# Patient Record
Sex: Female | Born: 2000
Health system: Southern US, Community
[De-identification: ages and names within clinical notes are randomized; demographics above are authoritative.]

## PROBLEM LIST (undated history)

## (undated) DIAGNOSIS — N920 Excessive and frequent menstruation with regular cycle: Secondary | ICD-10-CM

## (undated) DIAGNOSIS — Z23 Encounter for immunization: Secondary | ICD-10-CM

## (undated) DIAGNOSIS — D6859 Other primary thrombophilia: Secondary | ICD-10-CM

## (undated) HISTORY — DX: Encounter for immunization: Z23

## (undated) HISTORY — DX: Excessive and frequent menstruation with regular cycle: N92.0

## (undated) HISTORY — PX: NO PAST SURGERIES: SHX2092

---
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
## 2022-01-17 ENCOUNTER — Ambulatory Visit (INDEPENDENT_AMBULATORY_CARE_PROVIDER_SITE_OTHER): Payer: 59

## 2022-01-17 DIAGNOSIS — Z3042 Encounter for surveillance of injectable contraceptive: Secondary | ICD-10-CM

## 2022-01-17 MED ORDER — MEDROXYPROGESTERONE ACETATE 150 MG/ML IM SUSP
150.0000 mg | Freq: Once | INTRAMUSCULAR | Status: AC
  Administered 2022-01-17: 150 mg via INTRAMUSCULAR

## 2022-01-17 NOTE — Progress Notes (Signed)
Patient presents today for Depo Provera injection within dates. Given IM Left Upper Outer Quadrant. Patient tolerated well. Patient reports she has been on Depo for 3-4 years now. She started menses last week that lasted 4-5 days. It was not heavy, but this is the second time this has happened. Advised will report to prescribing provider Elmo Putt Copland, PAC.  ?

## 2022-02-08 ENCOUNTER — Encounter: Payer: Self-pay | Admitting: Emergency Medicine

## 2022-02-08 ENCOUNTER — Emergency Department
Admission: EM | Admit: 2022-02-08 | Discharge: 2022-02-08 | Disposition: A | Discharge status: Home / Self Care | Payer: 59 | Attending: Emergency Medicine | Admitting: Emergency Medicine

## 2022-02-08 ENCOUNTER — Emergency Department: Payer: 59

## 2022-02-08 ENCOUNTER — Telehealth: Payer: Self-pay

## 2022-02-08 ENCOUNTER — Other Ambulatory Visit: Payer: Self-pay

## 2022-02-08 DIAGNOSIS — N939 Abnormal uterine and vaginal bleeding, unspecified: Secondary | ICD-10-CM | POA: Diagnosis present

## 2022-02-08 LAB — URINALYSIS, ROUTINE W REFLEX MICROSCOPIC
Bilirubin Urine: NEGATIVE
Glucose, UA: NEGATIVE mg/dL
Ketones, ur: 5 mg/dL — AB
Leukocytes,Ua: NEGATIVE
Nitrite: NEGATIVE
Protein, ur: NEGATIVE mg/dL
Specific Gravity, Urine: 1.035 — ABNORMAL HIGH (ref 1.005–1.030)
pH: 6 (ref 5.0–8.0)

## 2022-02-08 LAB — COMPREHENSIVE METABOLIC PANEL
ALT: 12 U/L (ref 0–44)
AST: 15 U/L (ref 15–41)
Albumin: 4 g/dL (ref 3.5–5.0)
Alkaline Phosphatase: 52 U/L (ref 38–126)
Anion gap: 7 (ref 5–15)
BUN: 12 mg/dL (ref 6–20)
CO2: 25 mmol/L (ref 22–32)
Calcium: 8.9 mg/dL (ref 8.9–10.3)
Chloride: 109 mmol/L (ref 98–111)
Creatinine, Ser: 1.16 mg/dL — ABNORMAL HIGH (ref 0.44–1.00)
GFR, Estimated: >60 mL/min (ref >60)
Glucose, Bld: 91 mg/dL (ref 70–99)
Potassium: 4 mmol/L (ref 3.5–5.1)
Sodium: 141 mmol/L (ref 135–145)
Total Bilirubin: 0.5 mg/dL (ref 0.3–1.2)
Total Protein: 7.8 g/dL (ref 6.5–8.1)

## 2022-02-08 LAB — CBC WITH DIFFERENTIAL/PLATELET
Abs Immature Granulocytes: 0.02 10*3/uL (ref 0.00–0.07)
Basophils Absolute: 0 10*3/uL (ref 0.0–0.1)
Basophils Relative: 0 %
Eosinophils Absolute: 0 10*3/uL (ref 0.0–0.5)
Eosinophils Relative: 1 %
HCT: 35 % — ABNORMAL LOW (ref 36.0–46.0)
Hemoglobin: 11.4 g/dL — ABNORMAL LOW (ref 12.0–15.0)
Immature Granulocytes: 0 %
Lymphocytes Relative: 29 %
Lymphs Abs: 1.7 10*3/uL (ref 0.7–4.0)
MCH: 31.8 pg (ref 26.0–34.0)
MCHC: 32.6 g/dL (ref 30.0–36.0)
MCV: 97.5 fL (ref 80.0–100.0)
Monocytes Absolute: 0.5 10*3/uL (ref 0.1–1.0)
Monocytes Relative: 10 %
Neutro Abs: 3.4 10*3/uL (ref 1.7–7.7)
Neutrophils Relative %: 60 %
Platelets: 231 10*3/uL (ref 150–400)
RBC: 3.59 MIL/uL — ABNORMAL LOW (ref 3.87–5.11)
RDW: 11.6 % (ref 11.5–15.5)
Smear Review: NORMAL
WBC: 5.7 10*3/uL (ref 4.0–10.5)
nRBC: 0 % (ref 0.0–0.2)

## 2022-02-08 LAB — PREGNANCY, URINE: Preg Test, Ur: NEGATIVE

## 2022-02-08 LAB — POC URINE PREG, ED: Preg Test, Ur: NEGATIVE

## 2022-02-08 NOTE — ED Triage Notes (Signed)
C/O heaving vaginal bleeding and cramping.  Patient has history of Protein C deficiency and mom has history of fibroids and blood clots.  Patient currently taking Depo injections for birth control.  Last injection was last month.  AAOx3.  Skin warm and dry. NAD

## 2022-02-08 NOTE — Telephone Encounter (Signed)
Called Brenda Watson back and asked her how bad was [name] bleeding and she said she was soaking a pad within a hour. Advised her to go to the ER to get checked out and get an ultrasound.

## 2022-02-08 NOTE — ED Provider Notes (Signed)
[facility] Provider Note  Patient Contact: 3:34 PM (approximate)   History   Vaginal Bleeding   HPI  Brenda Watson is a 21 y.o. female with a history of protein C deficiency and menorrhagia presents to the emergency department with vaginal bleeding that started today.  Patient states that she has not had a menstrual cycle for the past 4 years due to Depo-Provera injections.  She states that she has had some cramping but no abdominal pain.  No flank pain, dysuria or increased urinary frequency.  She denies falls or mechanisms of trauma.  Patient states that she has a family history of uterine fibroids and would like to be evaluated for such.  She denies fever and chills at home and possibility of pregnancy.      Physical Exam   Triage Vital Signs: ED Triage Vitals  Enc Vitals Group     BP 02/08/22 1515 128/86     Pulse Rate 02/08/22 1515 70     Resp 02/08/22 1515 20     Temp 02/08/22 1515 98.1 F (36.7 C)     Temp src --      SpO2 02/08/22 1515 98 %     Weight 02/08/22 1434 132 lb 15 oz (60.3 kg)     Height 02/08/22 1434 5' (1.524 m)     Head Circumference --      Peak Flow --      Pain Score 02/08/22 1434 0     Pain Loc --      Pain Edu? --      Excl. in GC? --     Most recent vital signs: Vitals:   02/08/22 1515 02/08/22 1700  BP: 128/86 122/74  Pulse: 70 74  Resp: 20 20  Temp: 98.1 F (36.7 C)   SpO2: 98% 98%     General: Alert and in no acute distress. Eyes:  PERRL. EOMI. Head: No acute traumatic findings ENT:      Ears:       Nose: No congestion/rhinnorhea.      Mouth/Throat: Mucous membranes are moist.  Neck: No stridor. No cervical spine tenderness to palpation. Cardiovascular:  Good peripheral perfusion Respiratory: Normal respiratory effort without tachypnea or retractions. Lungs CTAB. Good air entry to the bases with no decreased or absent breath sounds. Gastrointestinal: Bowel sounds 4 quadrants. Soft and nontender  to palpation. No guarding or rigidity. No palpable masses. No distention. No CVA tenderness. Musculoskeletal: Full range of motion to all extremities.  Neurologic:  No gross focal neurologic deficits are appreciated.  Skin:   No rash noted Other:   ED Results / Procedures / Treatments   Labs (all labs ordered are listed, but only abnormal results are displayed) Labs Reviewed  CBC WITH DIFFERENTIAL/PLATELET - Abnormal; Notable for the following components:      Result Value   RBC 3.59 (*)    Hemoglobin 11.4 (*)    HCT 35.0 (*)    All other components within normal limits  COMPREHENSIVE METABOLIC PANEL - Abnormal; Notable for the following components:   Creatinine, Ser 1.16 (*)    All other components within normal limits  URINALYSIS, ROUTINE W REFLEX MICROSCOPIC - Abnormal; Notable for the following components:   Color, Urine YELLOW (*)    APPearance HAZY (*)    Specific Gravity, Urine 1.035 (*)    Hgb urine dipstick SMALL (*)    Ketones, ur 5 (*)    Bacteria, UA FEW (*)  All other components within normal limits  PREGNANCY, URINE  POC URINE PREG, ED      RADIOLOGY  I personally viewed and evaluated these images as part of my medical decision making, as well as reviewing the written report by the radiologist.  ED Provider Interpretation: I personally interpreted pelvic ultrasound and there was no acute abnormality.   PROCEDURES:  Critical Care performed: No  Procedures   MEDICATIONS ORDERED IN ED: Medications - No data to display   IMPRESSION / MDM / ASSESSMENT AND PLAN / ED COURSE  I reviewed the triage vital signs and the nursing notes.                             Assessment and plan:  Vaginal bleeding Differential diagnosis includes, but is not limited to, menorrhagia, adenomyosis, hemorrhagic ovarian cyst  21 year old female presents to the emergency department with irregular vaginal bleeding.  CBC and CMP unremarkable.  Urine pregnancy test  negative.  Urinalysis is not suggestive of UTI.  Pelvic ultrasound shows no acute abnormality.  Recommended follow-up with gynecology.  Return precautions were given to return with new or worsening symptoms.      FINAL CLINICAL IMPRESSION(S) / ED DIAGNOSES   Final diagnoses:  Vaginal bleeding     Rx / DC Orders   ED Discharge Orders     None        Note:  This document was prepared using Dragon voice recognition software and may include unintentional dictation errors.   Pia Mau [place], PA-C 02/08/22 2346    Sharman Cheek, MD 02/13/22 1935

## 2022-02-08 NOTE — Discharge Instructions (Signed)
Please make follow-up appointment with gynecology.

## 2022-02-08 NOTE — Telephone Encounter (Signed)
Brenda Watson, Sallye Lat Sayler mother called triage line this morning regarding Brenda Watson's heavy bleeding, bad cramps and in a lot of pain. Byrd Hesselbach the mother does have fibroids, and  they both have the Protein C Deficiency and had a daughter to pass last year with this issue.

## 2022-04-03 ENCOUNTER — Other Ambulatory Visit: Payer: Self-pay | Admitting: Obstetrics and Gynecology

## 2022-04-03 DIAGNOSIS — Z3042 Encounter for surveillance of injectable contraceptive: Secondary | ICD-10-CM

## 2022-04-04 ENCOUNTER — Ambulatory Visit (INDEPENDENT_AMBULATORY_CARE_PROVIDER_SITE_OTHER): Payer: 59

## 2022-04-04 ENCOUNTER — Other Ambulatory Visit: Payer: Self-pay | Admitting: Obstetrics and Gynecology

## 2022-04-04 DIAGNOSIS — Z3042 Encounter for surveillance of injectable contraceptive: Secondary | ICD-10-CM

## 2022-04-04 MED ORDER — MEDROXYPROGESTERONE ACETATE 150 MG/ML IM SUSP
150.0000 mg | Freq: Once | INTRAMUSCULAR | Status: AC
  Administered 2022-04-04: 150 mg via INTRAMUSCULAR

## 2022-04-04 NOTE — Progress Notes (Signed)
Date last pap: pt has not had a pap smear d/t age. Last Depo-Provera: 5/15/223. Side Effects if any: n/a. Serum HCG indicated? N/a. Depo-Provera 150 mg IM given by: Doree Albee . Next appointment due 10/16-10/30.

## 2022-04-25 ENCOUNTER — Ambulatory Visit: Payer: 59 | Admitting: Physician Assistant

## 2022-05-23 ENCOUNTER — Ambulatory Visit: Payer: 59 | Admitting: Internal Medicine

## 2022-05-24 ENCOUNTER — Ambulatory Visit (INDEPENDENT_AMBULATORY_CARE_PROVIDER_SITE_OTHER): Payer: 59 | Admitting: Obstetrics and Gynecology

## 2022-05-24 ENCOUNTER — Encounter: Payer: Self-pay | Admitting: Obstetrics and Gynecology

## 2022-05-24 ENCOUNTER — Other Ambulatory Visit (HOSPITAL_COMMUNITY)
Admission: RE | Admit: 2022-05-24 | Discharge: 2022-05-24 | Disposition: A | Discharge status: Home / Self Care | Payer: 59 | Source: Ambulatory Visit | Attending: Obstetrics and Gynecology | Admitting: Obstetrics and Gynecology

## 2022-05-24 VITALS — BP 120/60 | Ht 61.0 in | Wt 150.0 lb

## 2022-05-24 DIAGNOSIS — Z3042 Encounter for surveillance of injectable contraceptive: Secondary | ICD-10-CM | POA: Diagnosis not present

## 2022-05-24 DIAGNOSIS — Z01419 Encounter for gynecological examination (general) (routine) without abnormal findings: Secondary | ICD-10-CM | POA: Diagnosis not present

## 2022-05-24 DIAGNOSIS — Z113 Encounter for screening for infections with a predominantly sexual mode of transmission: Secondary | ICD-10-CM | POA: Insufficient documentation

## 2022-05-24 DIAGNOSIS — D6859 Other primary thrombophilia: Secondary | ICD-10-CM | POA: Diagnosis not present

## 2022-05-24 MED ORDER — MEDROXYPROGESTERONE ACETATE 150 MG/ML IM SUSP: 150.0000 mg | INTRAMUSCULAR | Status: AC

## 2022-05-24 NOTE — Progress Notes (Signed)
PCP:  Margarita Mail, DO   Chief Complaint  Patient presents with   Gynecologic Exam    No concerns     HPI:      [name] is a 21 y.o. G0P0000 who LMP was No LMP recorded. Patient has had an injection., presents today for her annual examination.  Her menses are absent due to depo. Started for menorrhagia. Hx of Protein C def.  Dysmenorrhea none. She has occas BTB/dysmen before next depo due.   Sex activity: has female partner. No vaginal penetration.  Last Pap: N/A due to age Hx of STDs: none  There is no FH of breast cancer. There is a FH of ovarian vs uterine cancer in her mat aunt. Her mom will clarify dx. The patient does not do self-breast exams.  Tobacco use: The patient denies current or previous tobacco use. Alcohol use: none Marijuana daily  Exercise: moderately active  She does get adequate calcium and Vitamin D in her diet. Gardasil completed  Past Medical History:  Diagnosis Date   Menorrhagia    Protein C deficiency [facility])    mother states pt has deficiency. MD telephone call [facility] 12/06/17 says she does not but decreased levels on 2/19 labs   Vaccine for human papilloma virus (HPV) types 6, 11, 16, and 18 administered     Past Surgical History:  Procedure Laterality Date   TONSILLECTOMY Bilateral 09/04/2018   Procedure: TONSILLECTOMY;  Surgeon: Geanie Logan, MD;  Location: [facility];  Service: ENT;  Laterality: Bilateral;    Family History  Problem Relation Age of Onset   Protein C deficiency Mother    Migraines Mother    Cancer Sister 32       leoblastoma   Ovarian cancer Maternal Aunt 42       vs uterine   Hypertension Maternal Grandmother    Asthma Paternal Grandmother     Social History   Socioeconomic History   Marital status: Single    Spouse name: Not on file   Number of children: Not on file   Years of education: Not on file   Highest education level: Not on file  Occupational History   Not on file  Tobacco  Use   Smoking status: Never   Smokeless tobacco: Never  Vaping Use   Vaping Use: Never used  Substance and Sexual Activity   Alcohol use: No   Drug use: Yes    Types: Marijuana   Sexual activity: Yes    Birth control/protection: Injection    Comment: c females  Other Topics Concern   Not on file  Social History Narrative   Not on file   Social Determinants of Health   Financial Resource Strain: Not on file  Food Insecurity: Not on file  Transportation Needs: Not on file  Physical Activity: Not on file  Stress: Not on file  Social Connections: Not on file  Intimate Partner Violence: Not on file    No current outpatient medications on file.  Current Facility-Administered Medications:    [START ON 06/20/2022] medroxyPROGESTERone (DEPO-PROVERA) injection 150 mg, 150 mg, Intramuscular, Q90 days, Slade Pierpoint B, PA-C   ROS:  Review of Systems  Constitutional:  Negative for fatigue, fever and unexpected weight change.  Respiratory:  Negative for cough, shortness of breath and wheezing.   Cardiovascular:  Negative for chest pain, palpitations and leg swelling.  Gastrointestinal:  Negative for blood in stool, constipation, diarrhea, nausea and vomiting.  Endocrine: Negative for cold  intolerance, heat intolerance and polyuria.  Genitourinary:  Negative for dyspareunia, dysuria, flank pain, frequency, genital sores, hematuria, menstrual problem, pelvic pain, urgency, vaginal bleeding, vaginal discharge and vaginal pain.  Musculoskeletal:  Negative for back pain, joint swelling and myalgias.  Skin:  Negative for rash.  Neurological:  Negative for dizziness, syncope, light-headedness, numbness and headaches.  Hematological:  Negative for adenopathy.  Psychiatric/Behavioral:  Negative for agitation, confusion, dysphoric mood, sleep disturbance and suicidal ideas. The patient is not nervous/anxious.    BREAST: No symptoms   Objective: BP 120/60   Ht 5\' 1"  (1.549 m)   Wt 150  lb (68 kg)   BMI 28.34 kg/m    Physical Exam Constitutional:      Appearance: She is well-developed.  Genitourinary:     Vulva normal.     Right Labia: No rash, tenderness or lesions.    Left Labia: No tenderness, lesions or rash.    No vaginal discharge, erythema or tenderness.      Right Adnexa: not tender and no mass present.    Left Adnexa: not tender and no mass present.    No cervical friability or polyp.     Uterus is not enlarged or tender.  Breasts:    Right: No mass, nipple discharge, skin change or tenderness.     Left: No mass, nipple discharge, skin change or tenderness.  Neck:     Thyroid: No thyromegaly.  Cardiovascular:     Rate and Rhythm: Normal rate and regular rhythm.     Heart sounds: Normal heart sounds. No murmur heard. Pulmonary:     Effort: Pulmonary effort is normal.     Breath sounds: Normal breath sounds.  Abdominal:     Palpations: Abdomen is soft.     Tenderness: There is no abdominal tenderness. There is no guarding or rebound.  Musculoskeletal:        General: Normal range of motion.     Cervical back: Normal range of motion.  Lymphadenopathy:     Cervical: No cervical adenopathy.  Neurological:     General: No focal deficit present.     Mental Status: She is alert and oriented to person, place, and time.     Cranial Nerves: No cranial nerve deficit.  Skin:    General: Skin is warm and dry.  Psychiatric:        Mood and Affect: Mood normal.        Behavior: Behavior normal.        Thought Content: Thought content normal.        Judgment: Judgment normal.  Vitals reviewed.   HYMEN INTACT  Assessment/Plan: Encounter for annual routine gynecological examination  Screening for STD (sexually transmitted disease) - Plan: Cervicovaginal ancillary only  Encounter for surveillance of injectable contraceptive - Plan: medroxyPROGESTERone (DEPO-PROVERA) injection 150 mg; Rx RF. Doing well, cont ca/Vit D  Protein C deficiency  ([place])  Family history of ovarian cancer--Qualifies for cancer genetic testing age 60 if ovarian.  Meds ordered this encounter  Medications   medroxyPROGESTERone (DEPO-PROVERA) injection 150 mg             GYN counsel adequate intake of calcium and vitamin D, diet and exercise     F/U  Return in about 1 year (around 05/25/2023).  Hareem Surowiec B. Domnick Chervenak, PA-C 05/24/2022 3:20 PM

## 2022-05-24 NOTE — Patient Instructions (Signed)
I value your feedback and you entrusting us with your care. If you get a [facility] patient survey, I would appreciate you taking the time to let us know about your experience today. Thank you! ? ? ?

## 2022-05-26 ENCOUNTER — Encounter: Payer: Self-pay | Admitting: Internal Medicine

## 2022-05-26 ENCOUNTER — Ambulatory Visit
Admission: RE | Admit: 2022-05-26 | Discharge: 2022-05-26 | Disposition: A | Discharge status: Home / Self Care | Payer: 59 | Source: Ambulatory Visit | Attending: Internal Medicine | Admitting: Internal Medicine

## 2022-05-26 ENCOUNTER — Ambulatory Visit
Admission: RE | Admit: 2022-05-26 | Discharge: 2022-05-26 | Disposition: A | Discharge status: Home / Self Care | Payer: 59 | Attending: Internal Medicine | Admitting: Internal Medicine

## 2022-05-26 ENCOUNTER — Ambulatory Visit: Payer: 59 | Admitting: Internal Medicine

## 2022-05-26 VITALS — BP 116/72 | HR 85 | Temp 98.5°F | Resp 16 | Ht 61.75 in | Wt 152.3 lb

## 2022-05-26 DIAGNOSIS — Z13228 Encounter for screening for other metabolic disorders: Secondary | ICD-10-CM

## 2022-05-26 DIAGNOSIS — D649 Anemia, unspecified: Secondary | ICD-10-CM | POA: Diagnosis not present

## 2022-05-26 DIAGNOSIS — G8929 Other chronic pain: Secondary | ICD-10-CM

## 2022-05-26 DIAGNOSIS — M546 Pain in thoracic spine: Secondary | ICD-10-CM | POA: Insufficient documentation

## 2022-05-26 DIAGNOSIS — R198 Other specified symptoms and signs involving the digestive system and abdomen: Secondary | ICD-10-CM

## 2022-05-26 DIAGNOSIS — G43011 Migraine without aura, intractable, with status migrainosus: Secondary | ICD-10-CM

## 2022-05-26 LAB — CERVICOVAGINAL ANCILLARY ONLY
Chlamydia: NEGATIVE
Comment: NEGATIVE
Comment: NORMAL
Neisseria Gonorrhea: NEGATIVE

## 2022-05-26 MED ORDER — SUMATRIPTAN SUCCINATE 50 MG PO TABS: 50.0000 mg | ORAL_TABLET | ORAL | 0 refills | Status: DC | PRN

## 2022-05-26 NOTE — Progress Notes (Signed)
New Patient Office Visit  Subjective    Patient ID: Brenda Watson, female    DOB: Feb 17, 2001  Age: 21 y.o. MRN: [id number]  CC:  Chief Complaint  Patient presents with   Establish Care   Abdominal Pain    After eating w/ occasional diarrhea and constipation.  Hx  of anemia    HPI Brenda Watson presents to establish care.  Does have a history of menorrhagia, for which she has been on Depo-Provera injections since she was 88 or 21 years old.  She does follow with gynecology for this.  She is consistent with her timing of each injection.  She usually does not have periods while on the Depo-Provera, however she did have some breakthrough bleeding that lasted a few days the last 2 to 3 months a few days prior to when she was due for her next injection.  Of note, she did have some anemia on her most recent labs from June.  Her hemoglobin was 11.4.  Abdominal Pain: -Occurs about 15 minutes after eating, does not matter what type of food she is eating -Does have a sensitivity to dairy but tries to avoid this -Is having alternating diarrhea and constipation.  Diarrhea is very watery and constipation is small amount of stool without completely finishing.  She denies blood or dark stools. -She does have associated bilateral lower quadrant pain prior to the bowel movement that will resolve afterwards.  Thoracic back pain: -Chronic, will have mild episodes of thoracic pain at the level of T7-T8 on the left side.   -This will happen with stretching forward or certain positions and will cause a sharp electric-like pain that will last a couple seconds to a minute at most. -She does not take any medication for this -She was told that she has "advanced arthritis" in her back by her pediatrician -Lumbar x-ray and lumbar MRI done in August 2020 both negative  MIGRAINES Duration: years Onset: sudden Quality: sharp, dull, and aching Frequency: a few times a week -occurring about 3 times a week   Headache duration: Hours to all day Alleviating factors: Nothing, rest Aggravating factors: Bright lights, loud sounds and smells Headache status at time of visit: asymptomatic Treatments attempted: Treatments attempted: rest, ibuprofen, and aleve", excedrine   Aura: no Nausea:  no Vomiting: no Photophobia:  yes Phonophobia:  yes Mother and sister both have migraines and all 3 were patient at migraine clinic, however her sister was recently diagnosed with a brain tumor and they are no longer patients at the migraine clinic.  No outpatient encounter medications on file as of 05/26/2022.   Facility-Administered Encounter Medications as of 05/26/2022  Medication   [START ON 06/20/2022] medroxyPROGESTERone (DEPO-PROVERA) injection 150 mg    Past Medical History:  Diagnosis Date   Menorrhagia    Protein C deficiency [facility])    mother states pt has deficiency. MD telephone call [facility] 12/06/17 says she does not but decreased levels on 2/19 labs   Vaccine for human papilloma virus (HPV) types 6, 11, 16, and 18 administered     Past Surgical History:  Procedure Laterality Date   TONSILLECTOMY Bilateral 09/04/2018   Procedure: TONSILLECTOMY;  Surgeon: Geanie Logan, MD;  Location: [facility];  Service: ENT;  Laterality: Bilateral;    Family History  Problem Relation Age of Onset   Protein C deficiency Mother    Migraines Mother    Cancer Sister 53       leoblastoma   Ovarian cancer Maternal  Aunt 42       vs uterine   Hypertension Maternal Grandmother    Asthma Paternal Grandmother     Social History   Socioeconomic History   Marital status: Single    Spouse name: Not on file   Number of children: Not on file   Years of education: Not on file   Highest education level: Not on file  Occupational History   Not on file  Tobacco Use   Smoking status: Never   Smokeless tobacco: Never  Vaping Use   Vaping Use: Former  Substance and Sexual Activity   Alcohol use: No    Drug use: Yes    Types: Marijuana   Sexual activity: Yes    Birth control/protection: Injection    Comment: c females  Other Topics Concern   Not on file  Social History Narrative   Not on file   Social Determinants of Health   Financial Resource Strain: Not on file  Food Insecurity: Not on file  Transportation Needs: Not on file  Physical Activity: Not on file  Stress: Not on file  Social Connections: Not on file  Intimate Partner Violence: Not on file    Review of Systems  Constitutional:  Negative for chills and fever.  Eyes:  Negative for blurred vision.  Respiratory:  Negative for shortness of breath.   Cardiovascular:  Negative for chest pain.  Gastrointestinal:  Positive for abdominal pain, constipation and diarrhea. Negative for blood in stool, melena, nausea and vomiting.  Genitourinary:  Negative for dysuria and hematuria.  Musculoskeletal:  Positive for back pain.  Neurological:  Positive for headaches.        Objective    BP 116/72   Pulse 85   Temp 98.5 F (36.9 C)   Resp 16   Ht 5' 1.75" (1.568 m)   Wt 152 lb 4.8 oz (69.1 kg)   SpO2 96%   BMI 28.08 kg/m   Physical Exam Constitutional:      Appearance: Normal appearance.  HENT:     Head: Normocephalic and atraumatic.     Mouth/Throat:     Mouth: Mucous membranes are moist.     Pharynx: Oropharynx is clear.  Eyes:     Extraocular Movements: Extraocular movements intact.     Conjunctiva/sclera: Conjunctivae normal.     Pupils: Pupils are equal, round, and reactive to light.  Cardiovascular:     Rate and Rhythm: Normal rate and regular rhythm.  Pulmonary:     Effort: Pulmonary effort is normal.     Breath sounds: Normal breath sounds.  Abdominal:     General: Bowel sounds are normal. There is no distension.     Palpations: Abdomen is soft.     Tenderness: There is abdominal tenderness. There is no right CVA tenderness, left CVA tenderness, guarding or rebound.     Comments: Tender to  palpation in bilateral lower quadrants  Musculoskeletal:        General: No tenderness. Normal range of motion.     Right lower leg: No edema.     Left lower leg: No edema.     Comments: No tenderness to palpation of thoracic spine with appropriate range of motion.  Skin:    General: Skin is warm and dry.  Neurological:     General: No focal deficit present.     Mental Status: She is alert. Mental status is at baseline.  Psychiatric:        Mood and Affect: Mood  normal.        Behavior: Behavior normal.         Assessment & Plan:   1. Intractable migraine without aura and with status migrainosus: Patient only taking 2-3 Advil or Aleve with headaches.  Will treat with Imitrex 50 mg as needed for migraine.  Follow-up in 1 month for recheck.  - SUMAtriptan (IMITREX) 50 MG tablet; Take 1 tablet (50 mg total) by mouth as needed for migraine. May repeat in 2 hours if headache persists or recurs.  Dispense: 1 tablet; Refill: 0  2. Chronic left-sided thoracic back pain: Consistent with muscle spasm however will obtain a thoracic x-ray due to patient's wishes and vague history of arthritis.  Recommend gentle stretching, moist heat and anti-inflammatories as needed for pain.  - DG Thoracic Spine 2 View; Future  3. Alternating constipation and diarrhea: Consistent with IBS, discussed low FODMAP diet, which was printed and given to her.  We will also obtain celiac screening along with labs below.  - Celiac Disease Panel  4. Anemia, unspecified type: Hemoglobin slightly low on last set of labs, recheck today along with iron panel.  Patient is not currently having regular menstrual cycles, she is on Depo-Provera.  - CBC w/Diff/Platelet - Fe+TIBC+Fer  5. Screening for metabolic disorder: Recheck BMP today as well.  - Basic Metabolic Panel (BMET)   Return in about 4 weeks (around 06/23/2022).   Margarita Mail, DO

## 2022-05-26 NOTE — Patient Instructions (Addendum)
It was great seeing you today!  Plan discussed at today's visit: -Blood work ordered today, results will be uploaded to Brenda Watson.  -Back x-ray today although pain is more likely muscular, recommend gentle stretching and anti-inflammatory gel like Voltaren for pain -Medication sent to pharmacy for migraines - take at the onset of symptoms and can take 2 hours later if symptoms still persist. Do not take more than 2 doses in 24 hour period.  Follow up in: 1 month   Take care and let us know if you have any questions or concerns prior to your next visit.  Dr. Rosana Watson  Low-FODMAP Eating Plan  FODMAP stands for fermentable oligosaccharides, disaccharides, monosaccharides, and polyols. These are sugars that are hard for some people to digest. A low-FODMAP eating plan may help some people who have irritable bowel syndrome (IBS) and certain other bowel (intestinal) diseases to manage their symptoms. This meal plan can be complicated to follow. Work with a diet and nutrition specialist (dietitian) to make a low-FODMAP eating plan that is right for you. A dietitian can help make sure that you get enough nutrition from this diet. What are tips for following this plan? Reading food labels Check labels for hidden FODMAPs such as: High-fructose syrup. Honey. Agave. Natural fruit flavors. Onion or garlic powder. Choose low-FODMAP foods that contain 3-4 grams of fiber per serving. Check food labels for serving sizes. Eat only one serving at a time to make sure FODMAP levels stay low. Shopping Shop with a list of foods that are recommended on this diet and make a meal plan. Meal planning Follow a low-FODMAP eating plan for up to 6 weeks, or as told by your health care provider or dietitian. To follow the eating plan: Eliminate high-FODMAP foods from your diet completely. Choose only low-FODMAP foods to eat. You will do this for 2-6 weeks. Gradually reintroduce high-FODMAP foods into your diet one at a  time. Most people should wait a few days before introducing the next new high-FODMAP food into their meal plan. Your dietitian can recommend how quickly you may reintroduce foods. Keep a daily record of what and how much you eat and drink. Make note of any symptoms that you have after eating. Review your daily record with a dietitian regularly to identify which foods you can eat and which foods you should avoid. General tips Drink enough fluid each day to keep your urine pale yellow. Avoid processed foods. These often have added sugar and may be high in FODMAPs. Avoid most dairy products, whole grains, and sweeteners. Work with a dietitian to make sure you get enough fiber in your diet. Avoid high FODMAP foods at meals to manage symptoms. Recommended foods Fruits Bananas, oranges, tangerines, lemons, limes, blueberries, raspberries, strawberries, grapes, cantaloupe, honeydew melon, kiwi, papaya, passion fruit, and pineapple. Limited amounts of dried cranberries, banana chips, and shredded coconut. Vegetables Eggplant, zucchini, cucumber, peppers, green beans, bean sprouts, lettuce, arugula, kale, Swiss chard, spinach, collard greens, bok choy, summer squash, potato, and tomato. Limited amounts of corn, carrot, and sweet potato. Green parts of scallions. Grains Gluten-free grains, such as rice, oats, buckwheat, quinoa, corn, polenta, and millet. Gluten-free pasta, bread, or cereal. Rice noodles. Corn tortillas. Meats and other proteins Unseasoned beef, pork, poultry, or fish. Eggs. Brenda Watson. Tofu (firm) and tempeh. Limited amounts of nuts and seeds, such as almonds, walnuts, [place] nuts, pecans, peanuts, nut butters, pumpkin seeds, chia seeds, and sunflower seeds. Dairy Lactose-free milk, yogurt, and kefir. Lactose-free cottage cheese and ice  cream. Non-dairy milks, such as almond, coconut, hemp, and rice milk. Non-dairy yogurt. Limited amounts of goat cheese, brie, mozzarella, parmesan, swiss, and  other hard cheeses. Fats and oils Butter-free spreads. Vegetable oils, such as olive, canola, and sunflower oil. Seasoning and other foods Artificial sweeteners with names that do not end in "ol," such as aspartame, saccharine, and stevia. Maple syrup, white table sugar, raw sugar, brown sugar, and molasses. Mayonnaise, soy sauce, and tamari. Fresh basil, coriander, parsley, rosemary, and thyme. Beverages Water and mineral water. Sugar-sweetened soft drinks. Small amounts of orange juice or cranberry juice. Black and green tea. Most dry wines. Coffee. The items listed above may not be a complete list of foods and beverages you can eat. Contact a dietitian for more information. Foods to avoid Fruits Fresh, dried, and juiced forms of apple, pear, watermelon, peach, plum, cherries, apricots, blackberries, boysenberries, figs, nectarines, and mango. Avocado. Vegetables Chicory root, artichoke, asparagus, cabbage, snow peas, Brussels sprouts, broccoli, sugar snap peas, mushrooms, celery, and cauliflower. Onions, garlic, leeks, and the white part of scallions. Grains Wheat, including kamut, durum, and semolina. Barley and bulgur. Couscous. Wheat-based cereals. Wheat noodles, bread, crackers, and pastries. Meats and other proteins Fried or fatty meat. Sausage. Cashews and pistachios. Soybeans, baked beans, black beans, chickpeas, kidney beans, fava beans, navy beans, lentils, black-eyed peas, and split peas. Dairy Milk, yogurt, ice cream, and soft cheese. Cream and sour cream. Milk-based sauces. Custard. Buttermilk. Soy milk. Seasoning and other foods Any sugar-free gum or candy. Foods that contain artificial sweeteners such as sorbitol, mannitol, isomalt, or xylitol. Foods that contain honey, high-fructose corn syrup, or agave. Bouillon, vegetable stock, beef stock, and chicken stock. Garlic and onion powder. Condiments made with onion, such as hummus, chutney, pickles, relish, salad dressing, and  salsa. Tomato paste. Beverages Chicory-based drinks. Coffee substitutes. Chamomile tea. Fennel tea. Sweet or fortified wines such as port or sherry. Diet soft drinks made with isomalt, mannitol, maltitol, sorbitol, or xylitol. Apple, pear, and mango juice. Juices with high-fructose corn syrup. The items listed above may not be a complete list of foods and beverages you should avoid. Contact a dietitian for more information. Summary FODMAP stands for fermentable oligosaccharides, disaccharides, monosaccharides, and polyols. These are sugars that are hard for some people to digest. A low-FODMAP eating plan is a short-term diet that helps to ease symptoms of certain bowel diseases. The eating plan usually lasts up to 6 weeks. After that, high-FODMAP foods are reintroduced gradually and one at a time. This can help you find out which foods may be causing symptoms. A low-FODMAP eating plan can be complicated. It is best to work with a dietitian who has experience with this type of plan. This information is not intended to replace advice given to you by your health care provider. Make sure you discuss any questions you have with your health care provider. Document Revised: 01/09/2020 Document Reviewed: 01/09/2020 Elsevier Patient Education  2023 [employer].

## 2022-05-27 LAB — CBC WITH DIFFERENTIAL/PLATELET
Absolute Monocytes: 398 cells/uL (ref 200–950)
Basophils Absolute: 20 cells/uL (ref 0–200)
Basophils Relative: 0.5 %
Eosinophils Absolute: 117 cells/uL (ref 15–500)
Eosinophils Relative: 3 %
HCT: 37 % (ref 35.0–45.0)
Hemoglobin: 12.4 g/dL (ref 11.7–15.5)
Lymphs Abs: 1661 cells/uL (ref 850–3900)
MCH: 33.2 pg — ABNORMAL HIGH (ref 27.0–33.0)
MCHC: 33.5 g/dL (ref 32.0–36.0)
MCV: 99.2 fL (ref 80.0–100.0)
MPV: 10.7 fL (ref 7.5–12.5)
Monocytes Relative: 10.2 %
Neutro Abs: 1704 cells/uL (ref 1500–7800)
Neutrophils Relative %: 43.7 %
Platelets: 239 10*3/uL (ref 140–400)
RBC: 3.73 10*6/uL — ABNORMAL LOW (ref 3.80–5.10)
RDW: 11.5 % (ref 11.0–15.0)
Total Lymphocyte: 42.6 %
WBC: 3.9 10*3/uL (ref 3.8–10.8)

## 2022-05-27 LAB — BASIC METABOLIC PANEL
BUN: 9 mg/dL (ref 7–20)
CO2: 24 mmol/L (ref 20–32)
Calcium: 9.2 mg/dL (ref 8.9–10.4)
Chloride: 109 mmol/L (ref 98–110)
Creat: 0.8 mg/dL (ref 0.50–0.96)
Glucose, Bld: 79 mg/dL (ref 65–99)
Potassium: 4.5 mmol/L (ref 3.8–5.1)
Sodium: 139 mmol/L (ref 135–146)

## 2022-05-27 LAB — CELIAC DISEASE PANEL
(tTG) Ab, IgA: <1 U/mL
(tTG) Ab, IgG: <1 U/mL
Gliadin IgA: <1 U/mL
Gliadin IgG: <1 U/mL
Immunoglobulin A: 258 mg/dL (ref 47–310)

## 2022-05-27 LAB — IRON,TIBC AND FERRITIN PANEL
%SAT: 30 % (calc) (ref 15–45)
Ferritin: 79 ng/mL (ref 16–154)
Iron: 81 ug/dL (ref 27–164)
TIBC: 274 mcg/dL (calc) (ref 271–448)

## 2022-05-29 ENCOUNTER — Other Ambulatory Visit: Payer: Self-pay | Admitting: Internal Medicine

## 2022-05-29 DIAGNOSIS — G43011 Migraine without aura, intractable, with status migrainosus: Secondary | ICD-10-CM

## 2022-05-30 MED ORDER — SUMATRIPTAN SUCCINATE 50 MG PO TABS: 50.0000 mg | ORAL_TABLET | ORAL | 0 refills | Status: DC | PRN

## 2022-06-21 ENCOUNTER — Ambulatory Visit (INDEPENDENT_AMBULATORY_CARE_PROVIDER_SITE_OTHER): Payer: 59

## 2022-06-21 ENCOUNTER — Ambulatory Visit: Payer: 59

## 2022-06-21 VITALS — BP 135/90 | HR 71 | Ht 61.0 in | Wt 156.0 lb

## 2022-06-21 DIAGNOSIS — Z3042 Encounter for surveillance of injectable contraceptive: Secondary | ICD-10-CM

## 2022-06-21 MED ORDER — MEDROXYPROGESTERONE ACETATE 150 MG/ML IM SUSP
150.0000 mg | Freq: Once | INTRAMUSCULAR | Status: AC
  Administered 2022-06-21: 150 mg via INTRAMUSCULAR

## 2022-06-21 NOTE — Progress Notes (Addendum)
Date last pap: n/a. Last Depo-Provera: 04/04/22. Side Effects if any: n/a. Serum HCG indicated? no. Depo-Provera 150 mg IM given by: Natale Lay. Next appointment due 09/13/22.

## 2022-06-23 ENCOUNTER — Ambulatory Visit: Payer: 59 | Admitting: Internal Medicine

## 2022-06-23 ENCOUNTER — Encounter: Payer: Self-pay | Admitting: Internal Medicine

## 2022-06-23 VITALS — BP 122/72 | HR 100 | Temp 98.7°F | Resp 18 | Ht 61.75 in | Wt 156.1 lb

## 2022-06-23 DIAGNOSIS — F419 Anxiety disorder, unspecified: Secondary | ICD-10-CM | POA: Diagnosis not present

## 2022-06-23 DIAGNOSIS — K582 Mixed irritable bowel syndrome: Secondary | ICD-10-CM

## 2022-06-23 DIAGNOSIS — G43011 Migraine without aura, intractable, with status migrainosus: Secondary | ICD-10-CM

## 2022-06-23 MED ORDER — SERTRALINE HCL 25 MG PO TABS: 25.0000 mg | ORAL_TABLET | Freq: Every day | ORAL | 3 refills | Status: DC

## 2022-06-23 MED ORDER — SUMATRIPTAN SUCCINATE 100 MG PO TABS: ORAL_TABLET | ORAL | 1 refills | Status: DC

## 2022-06-23 MED ORDER — PROPRANOLOL HCL 20 MG PO TABS: 20.0000 mg | ORAL_TABLET | Freq: Two times a day (BID) | ORAL | 1 refills | Status: DC

## 2022-06-23 NOTE — Progress Notes (Signed)
Established Patient Office Visit  Subjective    Patient ID: Brenda Watson, female    DOB: 12-09-2000  Age: 21 y.o. MRN: [id number]  CC:  Chief Complaint  Patient presents with   Follow-up    HPI Brenda Watson presents for follow up.    Abdominal Pain/IBS: -Occurs about 15 minutes after eating, does not matter what type of food she is eating -Does have a sensitivity to dairy but tries to avoid this -Is having alternating diarrhea and constipation.  Diarrhea is very watery and constipation is small amount of stool without completely finishing.  She denies blood or dark stools. -She does have associated bilateral lower quadrant pain prior to the bowel movement that will resolve afterwards. 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  Anxiety: -Mood status: stable -Current treatment: Nothing Psychotherapy/counseling: no  Previous psychiatric medications: zoloft - had been on 50 mg in 2021 and did well, states she just did not receive refills for this medication and never went back on it. Did well overall, no side effects or issues while she was on it.  Depressed mood: no Anxious mood: yes     06/23/2022   10:42 AM 05/26/2022   11:18 AM  Depression screen PHQ 2/9  Decreased Interest 0 0  Down, Depressed, Hopeless 0 0  PHQ - 2 Score 0 0  Altered sleeping 0 0  Tired, decreased energy 0 0  Change in appetite 0 0  Feeling bad or failure about yourself  0 0  Trouble concentrating 0 0  Moving slowly or fidgety/restless 0 0  Suicidal thoughts 0 0  PHQ-9 Score 0 0  Difficult doing work/chores Not difficult at all Not difficult at all   MIGRAINES Duration: years Onset: sudden Quality: sharp, dull, and aching Frequency: a few times a week -occurring about 3 times a week. Since her last visit has had about 1-2 migraines a week. Headache duration: Hours to all day Alleviating  factors: Rest. Imitrex 50 mg would help less severe headaches  but she did have a more severe one that 2 doses did not touch. Aggravating factors: Bright lights, loud sounds and smells. Pork.  Headache status at time of visit: asymptomatic Treatments attempted: Treatments attempted: rest, ibuprofen, and aleve", excedrine  Imitrex 50 mg Aura: no Nausea:  no Vomiting: no Photophobia:  yes Phonophobia:  yes Mother and sister both have migraines and all 3 were patient at migraine clinic, however her sister was recently diagnosed with a brain tumor and they are no longer patients at the migraine clinic.   Outpatient Encounter Medications as of 06/23/2022  Medication Sig   SUMAtriptan (IMITREX) 50 MG tablet Take 1 tablet (50 mg total) by mouth as needed for migraine. May repeat in 2 hours if headache persists or recurs.   Facility-Administered Encounter Medications as of 06/23/2022  Medication   medroxyPROGESTERone (DEPO-PROVERA) injection 150 mg    Past Medical History:  Diagnosis Date   Menorrhagia    Protein C deficiency [facility])    mother states pt has deficiency. MD telephone call [facility] 12/06/17 says she does not but decreased levels on 2/19 labs   Vaccine for human papilloma virus (HPV) types 6, 11, 16, and 18 administered     Past Surgical History:  Procedure Laterality Date   TONSILLECTOMY Bilateral 09/04/2018   Procedure: TONSILLECTOMY;  Surgeon: Clyde Canterbury, MD;  Location: [place];  Service: ENT;  Laterality: Bilateral;    Family History  Problem Relation Age of Onset   Protein C deficiency Mother    Migraines Mother    Cancer Sister 86       leoblastoma   Ovarian cancer Maternal Aunt 42       vs uterine   Hypertension Maternal Grandmother    Asthma Paternal Grandmother     Social History   Socioeconomic History   Marital status: Single    Spouse name: Not on file   Number of children: Not on file   Years of education: Not on file   Highest education  level: Not on file  Occupational History   Not on file  Tobacco Use   Smoking status: Never   Smokeless tobacco: Never  Vaping Use   Vaping Use: Former  Substance and Sexual Activity   Alcohol use: No   Drug use: Yes    Types: Marijuana   Sexual activity: Yes    Birth control/protection: Injection    Comment: c females  Other Topics Concern   Not on file  Social History Narrative   Not on file   Social Determinants of Health   Financial Resource Strain: Not on file  Food Insecurity: Not on file  Transportation Needs: Not on file  Physical Activity: Not on file  Stress: Not on file  Social Connections: Not on file  Intimate Partner Violence: Not on file    Review of Systems  Constitutional:  Negative for chills and fever.  Respiratory:  Negative for shortness of breath.   Cardiovascular:  Negative for chest pain.  Gastrointestinal:  Positive for abdominal pain, constipation and diarrhea. Negative for blood in stool, melena, nausea and vomiting.  Genitourinary:  Negative for dysuria and hematuria.  Neurological:  Positive for headaches.      Objective    BP 122/72   Pulse 100   Temp 98.7 F (37.1 C)   Resp 18   Ht 5' 1.75" (1.568 m)   Wt 156 lb 1.6 oz (70.8 kg)   SpO2 98%   BMI 28.78 kg/m   Physical Exam Constitutional:      Appearance: Normal appearance.  HENT:     Head: Normocephalic and atraumatic.  Eyes:     Conjunctiva/sclera: Conjunctivae normal.  Cardiovascular:     Rate and Rhythm: Normal rate and regular rhythm.  Pulmonary:     Effort: Pulmonary effort is normal.     Breath sounds: Normal breath sounds.  Skin:    General: Skin is warm and dry.  Neurological:     General: No focal deficit present.     Mental Status: She is alert. Mental status is at baseline.  Psychiatric:        Mood and Affect: Mood normal.        Behavior: Behavior normal.       Assessment & Plan:   1. Intractable migraine without aura and with status  migrainosus: Patient has been on Imitrex 50 mg, which has helped her less severe migraines however it did not relieve her more severe symptoms.  We will increase her Imitrex to 100 mg to take as needed for migraines.  We will also start propanolol 20 mg twice daily for migraine prophylaxis.  Follow-up in 6 weeks for recheck.  - SUMAtriptan (IMITREX) 100 MG tablet; May repeat in 2 hours if headache persists or recurs. Do not take more than 2 doses in 24 hour period.  Dispense: 30 tablet; Refill:  1 - propranolol (INDERAL) 20 MG tablet; Take 1 tablet (20 mg total) by mouth 2 (two) times daily.  Dispense: 90 tablet; Refill: 1  2. Irritable bowel syndrome with both constipation and diarrhea/Anxiety: Patient is still having difficulty with IBS symptoms.  Celiac panel was negative.  She did try the FODMAP diet, however this was hard to stick to when she did not notice any difference.  She does have some underlying stress and anxiety, as she is in school and currently looking for a job.  She had been on Zoloft in the past and did well with this.  We will start her back on Zoloft 25 mg daily.  Follow-up in 6 weeks for recheck.  - sertraline (ZOLOFT) 25 MG tablet; Take 1 tablet (25 mg total) by mouth daily.  Dispense: 30 tablet; Refill: 3   Return in about 6 weeks (around 08/04/2022).   Teodora Medici, DO

## 2022-06-23 NOTE — Patient Instructions (Signed)
It was great seeing you today!  Plan discussed at today's visit: -Propanolol 20 mg twice a day to help prevent migraines. Can affect blood pressure, if you feel lightheaded or dizzy please let me know -Imitrex increased to 100 mg as needed for migraine. Can take a second dose after 2 hours but do not take more than 2 doses in 24 hours -Zoloft 25 mg sent to pharmacy as well to help with stress/anxiety and IBS symptoms. Can take several weeks to start working so stay consistent.   Follow up in: 6 weeks   Take care and let us know if you have any questions or concerns prior to your next visit.  Dr. Rosana Berger

## 2022-08-03 NOTE — Addendum Note (Signed)
Addended by: Cornelius Moras D on: 08/03/2022 10:05 AM   Modules accepted: Level of Service

## 2022-08-04 ENCOUNTER — Ambulatory Visit: Payer: 59 | Admitting: Internal Medicine

## 2022-08-07 NOTE — Progress Notes (Signed)
Established Patient Office Visit  Subjective    Patient ID: Brenda Watson, female    DOB: 09/21/2000  Age: 21 y.o. MRN: [id number]  CC:  Chief Complaint  Patient presents with   Follow-up   Depression    Discuss another medication    HPI Brenda Watson presents for follow up.    Abdominal Pain/IBS: -Occurs about 15 minutes after eating, does not matter what type of food she is eating -Does have a sensitivity to dairy but tries to avoid this -Is having alternating diarrhea and constipation.  Diarrhea is very watery and constipation is small amount of stool without completely finishing.  She denies blood or dark stools. -She does have associated bilateral lower quadrant pain prior to the bowel movement that will resolve afterwards. 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 Also states stress/anxiety is worse around this time of year anyway.  Anxiety: -Mood status: stable -Current treatment: Started on Zoloft 25 mg at last office visit but has a history of previous overdose associated with this medication in the past so she did not take it  Psychotherapy/counseling: no  Previous psychiatric medications: zoloft - had been on 50 mg in 2021 and did well but remembered she had an OD event with this medication  Depressed mood: no Anxious mood: yes     08/08/2022   10:07 AM 06/23/2022   10:42 AM 05/26/2022   11:18 AM  Depression screen PHQ 2/9  Decreased Interest 2 0 0  Down, Depressed, Hopeless 1 0 0  PHQ - 2 Score 3 0 0  Altered sleeping 2 0 0  Tired, decreased energy 2 0 0  Change in appetite 1 0 0  Feeling bad or failure about yourself  0 0 0  Trouble concentrating 0 0 0  Moving slowly or fidgety/restless 0 0 0  Suicidal thoughts 0 0 0  PHQ-9 Score 8 0 0  Difficult doing work/chores Somewhat difficult Not difficult at all Not difficult at all   MIGRAINES Duration:  years Onset: sudden Quality: sharp, dull, and aching Frequency: a few times a week -occurring about 3 times a week. Since her last visit has had about 1-2 migraines a week. Headache duration: Hours to all day Alleviating factors: Rest. Imitrex 50 mg would help less severe headaches  but she did have a more severe one that 2 doses did not touch.  Imitrex increased to 100 mg at last office visit and she was started on prophylactic propanolol 20 mg twice daily. Doing well with the Propanolol but still has occasional headaches. Hasn't tried increase Imitrex dose yet.  Aggravating factors: Bright lights, loud sounds and smells. Pork.  Headache status at time of visit: asymptomatic Treatments attempted: Treatments attempted: rest, ibuprofen, and aleve", excedrine  Imitrex 50 mg Aura: no Nausea:  no Vomiting: no Photophobia:  yes Phonophobia:  yes Mother and sister both have migraines and all 3 were patient at migraine clinic, however her sister was recently diagnosed with a brain tumor and they are no longer patients at the migraine clinic.   Outpatient Encounter Medications as of 08/08/2022  Medication Sig   FLUoxetine (PROZAC) 10 MG tablet Take 1 tablet (10 mg total) by mouth daily.   propranolol (INDERAL) 20 MG tablet Take 1 tablet (20 mg total) by mouth 2 (two) times daily.   SUMAtriptan (IMITREX) 100 MG tablet May repeat in 2 hours if  headache persists or recurs. Do not take more than 2 doses in 24 hour period.   [DISCONTINUED] sertraline (ZOLOFT) 25 MG tablet Take 1 tablet (25 mg total) by mouth daily. (Patient not taking: Reported on 08/08/2022)   Facility-Administered Encounter Medications as of 08/08/2022  Medication   medroxyPROGESTERone (DEPO-PROVERA) injection 150 mg    Past Medical History:  Diagnosis Date   Menorrhagia    Protein C deficiency [facility])    mother states pt has deficiency. MD telephone call [facility] 12/06/17 says she does not but decreased levels on 2/19 labs   Vaccine for  human papilloma virus (HPV) types 6, 11, 16, and 18 administered     Past Surgical History:  Procedure Laterality Date   TONSILLECTOMY Bilateral 09/04/2018   Procedure: TONSILLECTOMY;  Surgeon: Geanie Logan, MD;  Location: [facility];  Service: ENT;  Laterality: Bilateral;    Family History  Problem Relation Age of Onset   Protein C deficiency Mother    Migraines Mother    Cancer Sister 20       leoblastoma   Ovarian cancer Maternal Aunt 42       vs uterine   Hypertension Maternal Grandmother    Asthma Paternal Grandmother     Social History   Socioeconomic History   Marital status: Single    Spouse name: Not on file   Number of children: Not on file   Years of education: Not on file   Highest education level: Not on file  Occupational History   Not on file  Tobacco Use   Smoking status: Never   Smokeless tobacco: Never  Vaping Use   Vaping Use: Former  Substance and Sexual Activity   Alcohol use: No   Drug use: Yes    Types: Marijuana   Sexual activity: Yes    Birth control/protection: Injection    Comment: c females  Other Topics Concern   Not on file  Social History Narrative   Not on file   Social Determinants of Health   Financial Resource Strain: Not on file  Food Insecurity: Not on file  Transportation Needs: Not on file  Physical Activity: Not on file  Stress: Not on file  Social Connections: Not on file  Intimate Partner Violence: Not on file    Review of Systems  Constitutional:  Negative for chills and fever.  Respiratory:  Negative for shortness of breath.   Cardiovascular:  Negative for chest pain.  Gastrointestinal:  Positive for abdominal pain, constipation and diarrhea. Negative for blood in stool, melena, nausea and vomiting.  Genitourinary:  Negative for dysuria and hematuria.  Neurological:  Positive for headaches.  Psychiatric/Behavioral:  The patient is nervous/anxious.       Objective    BP 116/68   Pulse 64    Temp 98.9 F (37.2 C)   Resp 16   Ht 5' 1.75" (1.568 m)   Wt 158 lb 4.8 oz (71.8 kg)   SpO2 98%   BMI 29.19 kg/m   Physical Exam Constitutional:      Appearance: Normal appearance.  HENT:     Head: Normocephalic and atraumatic.  Eyes:     Conjunctiva/sclera: Conjunctivae normal.  Cardiovascular:     Rate and Rhythm: Normal rate and regular rhythm.  Pulmonary:     Effort: Pulmonary effort is normal.     Breath sounds: Normal breath sounds.  Skin:    General: Skin is warm and dry.  Neurological:     General: No focal  deficit present.     Mental Status: She is alert. Mental status is at baseline.  Psychiatric:        Mood and Affect: Mood normal.        Behavior: Behavior normal.       Assessment & Plan:   1. Anxiety: Discontinue Zoloft due to patient's history of overdose with this medication.  Her mother is on Prozac and does well, will start patient on Prozac 10 mg daily.  Follow-up in 6 weeks to recheck.  - FLUoxetine (PROZAC) 10 MG tablet; Take 1 tablet (10 mg total) by mouth daily.  Dispense: 30 tablet; Refill: 1  2. Intractable migraine without aura and with status migrainosus: Stable migraines are less frequent but still occurring.  Continue propanolol 20 mg twice daily and Imitrex as needed.   Return in about 6 weeks (around 09/19/2022).   Margarita Mail, DO

## 2022-08-08 ENCOUNTER — Encounter: Payer: Self-pay | Admitting: Internal Medicine

## 2022-08-08 ENCOUNTER — Ambulatory Visit: Payer: 59 | Admitting: Internal Medicine

## 2022-08-08 VITALS — BP 116/68 | HR 64 | Temp 98.9°F | Resp 16 | Ht 61.75 in | Wt 158.3 lb

## 2022-08-08 DIAGNOSIS — G43011 Migraine without aura, intractable, with status migrainosus: Secondary | ICD-10-CM | POA: Diagnosis not present

## 2022-08-08 DIAGNOSIS — F419 Anxiety disorder, unspecified: Secondary | ICD-10-CM | POA: Diagnosis not present

## 2022-08-08 MED ORDER — FLUOXETINE HCL 10 MG PO TABS: 10.0000 mg | ORAL_TABLET | Freq: Every day | ORAL | 1 refills | Status: DC

## 2022-08-08 NOTE — Patient Instructions (Addendum)
It was great seeing you today!  Plan discussed at today's visit: -Start Prozac 10 mg  -Continue Propanolol as you've been taking it and can take Imitrex to abort headaches   Follow up in: 6 weeks   Take care and let us know if you have any questions or concerns prior to your next visit.  Dr. Caralee Ates

## 2022-09-13 ENCOUNTER — Ambulatory Visit (INDEPENDENT_AMBULATORY_CARE_PROVIDER_SITE_OTHER): Payer: 59

## 2022-09-13 DIAGNOSIS — Z3042 Encounter for surveillance of injectable contraceptive: Secondary | ICD-10-CM | POA: Diagnosis not present

## 2022-09-13 MED ORDER — MEDROXYPROGESTERONE ACETATE 150 MG/ML IM SUSP
150.0000 mg | Freq: Once | INTRAMUSCULAR | Status: AC
  Administered 2022-09-13: 150 mg via INTRAMUSCULAR

## 2022-09-13 NOTE — Progress Notes (Deleted)
    NURSE VISIT NOTE  Subjective:    Patient ID: Brenda Watson, female    DOB: 07-31-2001, 22 y.o.   MRN: [id number]  HPI  Patient is a 22 y.o. G0P0000 female who presents for depo provera injection.   Objective:    BP 121/64   Pulse 74   Wt 162 lb 4.8 oz (73.6 kg)   BMI 29.93 kg/m   Date last pap: N/A. Last Depo-Provera: 06/21/22. Side Effects if any: none. Serum HCG indicated? ***. Depo-Provera 150 mg IM given by: Otelia Limes, CMA. Site: Right Upper Outer Quandrant  NDC: 37342-876-81 LOT: [id number] EXP: 12/2023  Lab Review  @THIS  VISIT ONLY@  Assessment:   No diagnosis found.   Plan:   Next appointment due between March 27 thru April 10.    Inis Sizer, CMA

## 2022-09-13 NOTE — Progress Notes (Addendum)
    NURSE VISIT NOTE  Subjective:    Patient ID: Barbera Perritt, female    DOB: [birth year], 22 y.o.   MRN: [id number]  HPI  Patient is a 22 y.o. G0P0000 female who presents for depo provera injection.   Objective:    BP 121/64   Pulse 74   Wt 162 lb 4.8 oz (73.6 kg)   BMI 29.93 kg/m   Date last pap: N/A. Last Depo-Provera: 06/21/2022. Side Effects if any: none. Serum HCG indicated?  Depo-Provera 150 mg IM given by: Otelia Limes, CMA. Site: Right Upper Outer Quandrant  Lab Review  @THIS  VISIT ONLY@  Assessment:   No diagnosis found.   Plan:   Next appointment due between March 27 thru April 10    Inis Sizer, CMA

## 2022-09-18 NOTE — Progress Notes (Unsigned)
Established Patient Office Visit  Subjective    Patient ID: Brenda Watson, female    DOB: 12/09/2000  Age: 22 y.o. MRN: [id number]  CC:  No chief complaint on file.   HPI Brenda Watson presents for follow up.    Abdominal Pain/IBS: -Occurs about 15 minutes after eating, does not matter what type of food she is eating -Does have a sensitivity to dairy but tries to avoid this -Is having alternating diarrhea and constipation.  Diarrhea is very watery and constipation is small amount of stool without completely finishing.  She denies blood or dark stools. -She does have associated bilateral lower quadrant pain prior to the bowel movement that will resolve afterwards. 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 Also states stress/anxiety is worse around this time of year anyway.  Anxiety: -Mood status: stable -Current treatment: Prozac 10 mg -Failed treatments: Zoloft 25 mg previously but has a history of previous overdose associated with this medication in the past  Psychotherapy/counseling: no  Previous psychiatric medications: zoloft - had been on 50 mg in 2021 and did well but remembered she had an OD event with this medication  Depressed mood: no Anxious mood: yes     08/08/2022   10:07 AM 06/23/2022   10:42 AM 05/26/2022   11:18 AM  Depression screen PHQ 2/9  Decreased Interest 2 0 0  Down, Depressed, Hopeless 1 0 0  PHQ - 2 Score 3 0 0  Altered sleeping 2 0 0  Tired, decreased energy 2 0 0  Change in appetite 1 0 0  Feeling bad or failure about yourself  0 0 0  Trouble concentrating 0 0 0  Moving slowly or fidgety/restless 0 0 0  Suicidal thoughts 0 0 0  PHQ-9 Score 8 0 0  Difficult doing work/chores Somewhat difficult Not difficult at all Not difficult at all   MIGRAINES Duration: years Onset: sudden Quality: sharp, dull, and aching Frequency: a few times a  week -occurring about 3 times a week. Since her last visit has had about 1-2 migraines a week. Headache duration: Hours to all day Alleviating factors: Rest. Imitrex 50 mg would help less severe headaches  but she did have a more severe one that 2 doses did not touch.  Imitrex increased to 100 mg at last office visit and she was started on prophylactic propanolol 20 mg twice daily. Doing well with the Propanolol but still has occasional headaches. Hasn't tried increase Imitrex dose yet.  Aggravating factors: Bright lights, loud sounds and smells. Pork.  Headache status at time of visit: asymptomatic Treatments attempted: Treatments attempted: rest, ibuprofen, and aleve", excedrine  Imitrex 100 mg Aura: no Nausea:  no Vomiting: no Photophobia:  yes Phonophobia:  yes Mother and sister both have migraines and all 3 were patient at migraine clinic, however her sister was recently diagnosed with a brain tumor and they are no longer patients at the migraine clinic.   Outpatient Encounter Medications as of 09/19/2022  Medication Sig   FLUoxetine (PROZAC) 10 MG tablet Take 1 tablet (10 mg total) by mouth daily.   propranolol (INDERAL) 20 MG tablet Take 1 tablet (20 mg total) by mouth 2 (two) times daily.   SUMAtriptan (IMITREX) 100 MG tablet May repeat in 2 hours if headache persists or recurs. Do not take more than 2 doses in 24 hour period.   Facility-Administered Encounter Medications  as of 09/19/2022  Medication   medroxyPROGESTERone (DEPO-PROVERA) injection 150 mg    Past Medical History:  Diagnosis Date   Menorrhagia    Protein C deficiency [facility])    mother states pt has deficiency. MD telephone call [facility] 12/06/17 says she does not but decreased levels on 2/19 labs   Vaccine for human papilloma virus (HPV) types 6, 11, 16, and 18 administered     Past Surgical History:  Procedure Laterality Date   TONSILLECTOMY Bilateral 09/04/2018   Procedure: TONSILLECTOMY;  Surgeon: Clyde Canterbury, MD;   Location: [place];  Service: ENT;  Laterality: Bilateral;    Family History  Problem Relation Age of Onset   Protein C deficiency Mother    Migraines Mother    Cancer Sister 51       leoblastoma   Ovarian cancer Maternal Aunt 42       vs uterine   Hypertension Maternal Grandmother    Asthma Paternal Grandmother     Social History   Socioeconomic History   Marital status: Single    Spouse name: Not on file   Number of children: Not on file   Years of education: Not on file   Highest education level: Not on file  Occupational History   Not on file  Tobacco Use   Smoking status: Never   Smokeless tobacco: Never  Vaping Use   Vaping Use: Former  Substance and Sexual Activity   Alcohol use: No   Drug use: Yes    Types: Marijuana   Sexual activity: Yes    Birth control/protection: Injection    Comment: c females  Other Topics Concern   Not on file  Social History Narrative   Not on file   Social Determinants of Health   Financial Resource Strain: Not on file  Food Insecurity: Not on file  Transportation Needs: Not on file  Physical Activity: Not on file  Stress: Not on file  Social Connections: Not on file  Intimate Partner Violence: Not on file    Review of Systems  Constitutional:  Negative for chills and fever.  Respiratory:  Negative for shortness of breath.   Cardiovascular:  Negative for chest pain.  Gastrointestinal:  Positive for abdominal pain, constipation and diarrhea. Negative for blood in stool, melena, nausea and vomiting.  Genitourinary:  Negative for dysuria and hematuria.  Neurological:  Positive for headaches.  Psychiatric/Behavioral:  The patient is nervous/anxious.       Objective    There were no vitals taken for this visit.  Physical Exam Constitutional:      Appearance: Normal appearance.  HENT:     Head: Normocephalic and atraumatic.  Eyes:     Conjunctiva/sclera: Conjunctivae normal.  Cardiovascular:     Rate  and Rhythm: Normal rate and regular rhythm.  Pulmonary:     Effort: Pulmonary effort is normal.     Breath sounds: Normal breath sounds.  Skin:    General: Skin is warm and dry.  Neurological:     General: No focal deficit present.     Mental Status: She is alert. Mental status is at baseline.  Psychiatric:        Mood and Affect: Mood normal.        Behavior: Behavior normal.       Assessment & Plan:   1. Anxiety: Discontinue Zoloft due to patient's history of overdose with this medication.  Her mother is on Prozac and does well, will start patient on Prozac  10 mg daily.  Follow-up in 6 weeks to recheck.  - FLUoxetine (PROZAC) 10 MG tablet; Take 1 tablet (10 mg total) by mouth daily.  Dispense: 30 tablet; Refill: 1  2. Intractable migraine without aura and with status migrainosus: Stable migraines are less frequent but still occurring.  Continue propanolol 20 mg twice daily and Imitrex as needed.   No follow-ups on file.   Teodora Medici, DO

## 2022-09-19 ENCOUNTER — Ambulatory Visit: Payer: 59 | Admitting: Internal Medicine

## 2022-09-19 ENCOUNTER — Encounter: Payer: Self-pay | Admitting: Internal Medicine

## 2022-09-19 ENCOUNTER — Telehealth: Payer: Self-pay | Admitting: Internal Medicine

## 2022-09-19 VITALS — BP 118/76 | HR 73 | Temp 98.6°F | Resp 18 | Ht 61.75 in | Wt 163.3 lb

## 2022-09-19 DIAGNOSIS — G43011 Migraine without aura, intractable, with status migrainosus: Secondary | ICD-10-CM

## 2022-09-19 DIAGNOSIS — F419 Anxiety disorder, unspecified: Secondary | ICD-10-CM

## 2022-09-19 MED ORDER — SUMATRIPTAN SUCCINATE 100 MG PO TABS: ORAL_TABLET | ORAL | 1 refills | Status: DC

## 2022-09-19 MED ORDER — PROPRANOLOL HCL 20 MG PO TABS: 20.0000 mg | ORAL_TABLET | Freq: Two times a day (BID) | ORAL | 1 refills | Status: DC

## 2022-09-19 MED ORDER — FLUOXETINE HCL 20 MG PO TABS: 20.0000 mg | ORAL_TABLET | Freq: Every day | ORAL | 3 refills | Status: DC

## 2022-09-19 NOTE — Telephone Encounter (Signed)
Last RF 09/19/22  Requested Prescriptions  Refused Prescriptions Disp Refills   propranolol (INDERAL) 20 MG tablet [Pharmacy Med Name: PROPRANOLOL 20 MG TABLET] 90 tablet 1    Sig: TAKE 1 TABLET BY MOUTH TWICE A DAY     Cardiovascular:  Beta Blockers Passed - 09/19/2022  1:35 AM      Passed - Last BP in normal range    BP Readings from Last 1 Encounters:  09/19/22 118/76         Passed - Last Heart Rate in normal range    Pulse Readings from Last 1 Encounters:  09/19/22 73         Passed - Valid encounter within last 6 months    Recent Outpatient Visits           Today [facility] Teodora Medici, DO   1 month ago [place], DO   2 months ago Intractable migraine without aura and with status migrainosus   [place], DO   3 months ago Intractable migraine without aura and with status migrainosus   [facility] Teodora Medici, DO       Future Appointments             In 3 months Teodora Medici, [facility], [facility]

## 2022-09-19 NOTE — Patient Instructions (Signed)
It was great seeing you today!  Plan discussed at today's visit: -Prozac increased to 20 mg -Continue migraine medications, refilled but let me know if they are no longer working well for you  Follow up in: 3 months  Take care and let us know if you have any questions or concerns prior to your next visit.  Dr. Rosana Berger

## 2022-11-15 ENCOUNTER — Encounter: Payer: Self-pay | Admitting: Internal Medicine

## 2022-12-05 ENCOUNTER — Ambulatory Visit (INDEPENDENT_AMBULATORY_CARE_PROVIDER_SITE_OTHER): Payer: 59

## 2022-12-05 VITALS — BP 110/66 | HR 67 | Resp 16 | Ht 61.0 in | Wt 168.8 lb

## 2022-12-05 DIAGNOSIS — Z3042 Encounter for surveillance of injectable contraceptive: Secondary | ICD-10-CM | POA: Diagnosis not present

## 2022-12-05 DIAGNOSIS — N921 Excessive and frequent menstruation with irregular cycle: Secondary | ICD-10-CM

## 2022-12-05 MED ORDER — MEDROXYPROGESTERONE ACETATE 150 MG/ML IM SUSP
150.0000 mg | Freq: Once | INTRAMUSCULAR | Status: AC
  Administered 2022-12-05: 150 mg via INTRAMUSCULAR

## 2022-12-05 NOTE — Patient Instructions (Signed)

## 2022-12-05 NOTE — Progress Notes (Unsigned)
    NURSE VISIT NOTE  Subjective:    Patient ID: Cathe Kollmann, female    DOB: 19-Mar-2001, 22 y.o.   MRN: [id number]  HPI  Patient is a 22 y.o. G0P0000 female who presents for depo provera injection.   Objective:    BP 110/66   Pulse 67   Resp 16   Ht 5\' 1"  (1.549 m)   Wt 168 lb 12.8 oz (76.6 kg)   LMP  (LMP Unknown)   BMI 31.89 kg/m   Last Annual: Not age appropriate. Last pap: Not age appropriate. Last Depo-Provera: 09/13/2022. Side Effects if any: none. Serum HCG indicated? No . Depo-Provera 150 mg IM given by: Cristy Folks, CMA. Site: [place]  Lab Review  @THIS  VISIT ONLY@  Assessment:   1. Encounter for surveillance of injectable contraceptive      Plan:   Next appointment due between June 17 and July 1.    Chilton Greathouse, [facility]

## 2023-01-01 NOTE — Progress Notes (Deleted)
Established Patient Office Visit  Subjective    Patient ID: Brenda Watson, female    DOB: 09-Nov-2000  Age: 22 y.o. MRN: [id number]  CC:  No chief complaint on file.   HPI Brenda Watson presents for follow up.    Anxiety: -Mood status: stable -Current treatment: Prozac increased to 20 mg at LOV  -Failed treatments: Zoloft 25 mg previously but has a history of previous overdose associated with this medication in the past  Psychotherapy/counseling: no  Previous psychiatric medications: zoloft - had been on 50 mg in 2021 and did well but remembered she had an OD event with this medication  Depressed mood: no Anxious mood: yes     09/19/2022   10:46 AM 08/08/2022   10:07 AM 06/23/2022   10:42 AM 05/26/2022   11:18 AM  Depression screen PHQ 2/9  Decreased Interest 0 2 0 0  Down, Depressed, Hopeless 0 1 0 0  PHQ - 2 Score 0 3 0 0  Altered sleeping 2 2 0 0  Tired, decreased energy 2 2 0 0  Change in appetite 3 1 0 0  Feeling bad or failure about yourself  0 0 0 0  Trouble concentrating 0 0 0 0  Moving slowly or fidgety/restless 0 0 0 0  Suicidal thoughts 0 0 0 0  PHQ-9 Score 7 8 0 0  Difficult doing work/chores Somewhat difficult Somewhat difficult Not difficult at all Not difficult at all   MIGRAINES Duration: years Onset: sudden Quality: sharp, dull, and aching Frequency: a few times a week  Headache duration: Hours to all day Alleviating factors: Rest. Imitrex 50 mg would help less severe headaches  but she did have a more severe one that 2 doses did not touch.  Imitrex increased to 100 mg at last office visit and she was started on prophylactic propanolol 20 mg twice daily. Doing well with the Propanolol but still has occasional headaches. Hasn't tried increase Imitrex dose yet.  Aggravating factors: Bright lights, loud sounds and smells. Pork.  Headache status at time of visit: asymptomatic Treatments attempted: Treatments attempted: rest, ibuprofen, and aleve",  excedrine  Imitrex 100 mg Aura: no Nausea:  no Vomiting: no Photophobia:  yes Phonophobia:  yes Mother and sister both have migraines and all 3 were patient at migraine clinic, however her sister was recently diagnosed with a brain tumor and they are no longer patients at the migraine clinic.   Outpatient Encounter Medications as of 01/02/2023  Medication Sig   FLUoxetine (PROZAC) 20 MG tablet Take 1 tablet (20 mg total) by mouth daily.   propranolol (INDERAL) 20 MG tablet Take 1 tablet (20 mg total) by mouth 2 (two) times daily.   SUMAtriptan (IMITREX) 100 MG tablet May repeat in 2 hours if headache persists or recurs. Do not take more than 2 doses in 24 hour period.   Facility-Administered Encounter Medications as of 01/02/2023  Medication   medroxyPROGESTERone (DEPO-PROVERA) injection 150 mg    Past Medical History:  Diagnosis Date   Menorrhagia    Protein C deficiency [facility])    mother states pt has deficiency. MD telephone call [facility] 12/06/17 says she does not but decreased levels on 2/19 labs   Vaccine for human papilloma virus (HPV) types 6, 11, 16, and 18 administered     Past Surgical History:  Procedure Laterality Date   TONSILLECTOMY Bilateral 09/04/2018   Procedure: TONSILLECTOMY;  Surgeon: Geanie Logan, MD;  Location: [facility];  Service: ENT;  Laterality:  Bilateral;    Family History  Problem Relation Age of Onset   Protein C deficiency Mother    Migraines Mother    Cancer Sister 68       leoblastoma   Ovarian cancer Maternal Aunt 42       vs uterine   Hypertension Maternal Grandmother    Asthma Paternal Grandmother     Social History   Socioeconomic History   Marital status: Single    Spouse name: Not on file   Number of children: Not on file   Years of education: Not on file   Highest education level: Not on file  Occupational History   Not on file  Tobacco Use   Smoking status: Never   Smokeless tobacco: Never  Vaping Use   Vaping Use:  Former  Substance and Sexual Activity   Alcohol use: No   Drug use: Yes    Types: Marijuana   Sexual activity: Yes    Birth control/protection: Injection    Comment: c females  Other Topics Concern   Not on file  Social History Narrative   Not on file   Social Determinants of Health   Financial Resource Strain: Not on file  Food Insecurity: Not on file  Transportation Needs: Not on file  Physical Activity: Not on file  Stress: Not on file  Social Connections: Not on file  Intimate Partner Violence: Not on file    Review of Systems  Constitutional:  Negative for chills and fever.  Respiratory:  Negative for shortness of breath.   Cardiovascular:  Negative for chest pain.  Gastrointestinal:  Negative for nausea.  Neurological:  Positive for headaches.      Objective    LMP  (LMP Unknown)   Physical Exam Constitutional:      Appearance: Normal appearance.  HENT:     Head: Normocephalic and atraumatic.  Eyes:     Conjunctiva/sclera: Conjunctivae normal.  Cardiovascular:     Rate and Rhythm: Normal rate and regular rhythm.  Pulmonary:     Effort: Pulmonary effort is normal.     Breath sounds: Normal breath sounds.  Skin:    General: Skin is warm and dry.  Neurological:     General: No focal deficit present.     Mental Status: She is alert. Mental status is at baseline.  Psychiatric:        Mood and Affect: Mood normal.        Behavior: Behavior normal.       Assessment & Plan:   1. Anxiety: Doing well with Prozac, plan to increase to 20 mg daily. Follow up in 3 months.   - FLUoxetine (PROZAC) 20 MG tablet; Take 1 tablet (20 mg total) by mouth daily.  Dispense: 90 tablet; Refill: 3  2. Intractable migraine without aura and with status migrainosus: Continue prophylactic Propanolol 20 mg BID, Imitrex re-sent to pharmacy.    - propranolol (INDERAL) 20 MG tablet; Take 1 tablet (20 mg total) by mouth 2 (two) times daily.  Dispense: 90 tablet; Refill: 1 -  SUMAtriptan (IMITREX) 100 MG tablet; May repeat in 2 hours if headache persists or recurs. Do not take more than 2 doses in 24 hour period.  Dispense: 30 tablet; Refill: 1   No follow-ups on file.   Margarita Mail, DO

## 2023-01-02 ENCOUNTER — Ambulatory Visit: Payer: 59 | Admitting: Internal Medicine

## 2023-01-04 NOTE — Progress Notes (Signed)
Established Patient Office Visit  Subjective    Patient ID: Brenda Watson, female    DOB: 05/21/2001  Age: 22 y.o. MRN: [id number]  CC:  Chief Complaint  Patient presents with   Follow-up    HPI Brenda Watson presents for follow up.    Anxiety: -Mood status: stable -Current treatment: Prozac increased to 20 mg at LOV but hasn't really been taking it, uncertain if it or the Propanolol is making her tired. She has a rigorous schedule with school and work.  -Failed treatments: Zoloft 25 mg previously but has a history of previous overdose associated with this medication in the past  Psychotherapy/counseling: no  Previous psychiatric medications: zoloft - had been on 50 mg in 2021 and did well but remembered she had an OD event with this medication  Depressed mood: no Anxious mood: yes     01/06/2023    9:51 AM 09/19/2022   10:46 AM 08/08/2022   10:07 AM 06/23/2022   10:42 AM 05/26/2022   11:18 AM  Depression screen PHQ 2/9  Decreased Interest 2 0 2 0 0  Down, Depressed, Hopeless 1 0 1 0 0  PHQ - 2 Score 3 0 3 0 0  Altered sleeping 2 2 2  0 0  Tired, decreased energy 2 2 2  0 0  Change in appetite 0 3 1 0 0  Feeling bad or failure about yourself  0 0 0 0 0  Trouble concentrating 0 0 0 0 0  Moving slowly or fidgety/restless 0 0 0 0 0  Suicidal thoughts 0 0 0 0 0  PHQ-9 Score 7 7 8  0 0  Difficult doing work/chores Somewhat difficult Somewhat difficult Somewhat difficult Not difficult at all Not difficult at all   MIGRAINES Duration: years Onset: sudden Quality: sharp, dull, and aching Frequency: a few times a week - unchanged but not taking Propanolol currently  Headache duration: Hours to all day Alleviating factors: Rest. Imitrex 100 mg as needed working well as an abortive agent for her  Aggravating factors: Bright lights, loud sounds and smells. Pork.  Headache status at time of visit: asymptomatic Treatments attempted: Treatments attempted: rest, ibuprofen, and  aleve", excedrine  Imitrex 100 mg Aura: no Nausea:  no Vomiting: no Photophobia:  yes Phonophobia:  yes Mother and sister both have migraines and all 3 were patient at migraine clinic, however her sister was recently diagnosed with a brain tumor and they are no longer patients at the migraine clinic.   Outpatient Encounter Medications as of 01/06/2023  Medication Sig   FLUoxetine (PROZAC) 20 MG tablet Take 1 tablet (20 mg total) by mouth daily.   propranolol (INDERAL) 20 MG tablet Take 1 tablet (20 mg total) by mouth 2 (two) times daily.   SUMAtriptan (IMITREX) 100 MG tablet May repeat in 2 hours if headache persists or recurs. Do not take more than 2 doses in 24 hour period.   Facility-Administered Encounter Medications as of 01/06/2023  Medication   medroxyPROGESTERone (DEPO-PROVERA) injection 150 mg    Past Medical History:  Diagnosis Date   Menorrhagia    Protein C deficiency [facility])    mother states pt has deficiency. MD telephone call [facility] 12/06/17 says she does not but decreased levels on 2/19 labs   Vaccine for human papilloma virus (HPV) types 6, 11, 16, and 18 administered     Past Surgical History:  Procedure Laterality Date   TONSILLECTOMY Bilateral 09/04/2018   Procedure: TONSILLECTOMY;  Surgeon: Geanie Logan, MD;  Location: [facility];  Service: ENT;  Laterality: Bilateral;    Family History  Problem Relation Age of Onset   Protein C deficiency Mother    Migraines Mother    Cancer Sister 7       leoblastoma   Ovarian cancer Maternal Aunt 42       vs uterine   Hypertension Maternal Grandmother    Asthma Paternal Grandmother     Social History   Socioeconomic History   Marital status: Single    Spouse name: Not on file   Number of children: Not on file   Years of education: Not on file   Highest education level: Not on file  Occupational History   Not on file  Tobacco Use   Smoking status: Never   Smokeless tobacco: Never  Vaping Use   Vaping  Use: Former  Substance and Sexual Activity   Alcohol use: No   Drug use: Yes    Types: Marijuana   Sexual activity: Yes    Birth control/protection: Injection    Comment: c females  Other Topics Concern   Not on file  Social History Narrative   Not on file   Social Determinants of Health   Financial Resource Strain: Not on file  Food Insecurity: Not on file  Transportation Needs: Not on file  Physical Activity: Not on file  Stress: Not on file  Social Connections: Not on file  Intimate Partner Violence: Not on file    Review of Systems  Constitutional:  Negative for chills and fever.  Respiratory:  Negative for shortness of breath.   Cardiovascular:  Negative for chest pain.  Gastrointestinal:  Negative for nausea.  Neurological:  Positive for headaches.      Objective    BP 110/64   Pulse 87   Temp 98.1 F (36.7 C)   Resp 16   Ht 5' 1.75" (1.568 m)   Wt 168 lb 8 oz (76.4 kg)   SpO2 98%   BMI 31.07 kg/m   Physical Exam Constitutional:      Appearance: Normal appearance.  HENT:     Head: Normocephalic and atraumatic.  Eyes:     Conjunctiva/sclera: Conjunctivae normal.  Cardiovascular:     Rate and Rhythm: Normal rate and regular rhythm.  Pulmonary:     Effort: Pulmonary effort is normal.     Breath sounds: Normal breath sounds.  Skin:    General: Skin is warm and dry.  Neurological:     General: No focal deficit present.     Mental Status: She is alert. Mental status is at baseline.  Psychiatric:        Mood and Affect: Mood normal.        Behavior: Behavior normal.       Assessment & Plan:   1. Intractable migraine without aura and with status migrainosus: Still having 2-3 times a week but not taking Propanolol due to fatigue. Recommend taking both Propanolol and Prozac at night, if fatigue continues will discontinue Propanolol and try a different preventative. Imitrex 100 mg PRN refilled.  - SUMAtriptan (IMITREX) 100 MG tablet; May repeat in  2 hours if headache persists or recurs. Do not take more than 2 doses in 24 hour period.  Dispense: 30 tablet; Refill: 0  2. Anxiety: Continue Prozac 20 mg at night, does feel better when she takes it.    Return in about 3 months (around 04/08/2023).   Margarita Mail, DO

## 2023-01-06 ENCOUNTER — Encounter: Payer: Self-pay | Admitting: Internal Medicine

## 2023-01-06 ENCOUNTER — Ambulatory Visit: Payer: 59 | Admitting: Internal Medicine

## 2023-01-06 VITALS — BP 110/64 | HR 87 | Temp 98.1°F | Resp 16 | Ht 61.75 in | Wt 168.5 lb

## 2023-01-06 DIAGNOSIS — F419 Anxiety disorder, unspecified: Secondary | ICD-10-CM | POA: Diagnosis not present

## 2023-01-06 DIAGNOSIS — G43011 Migraine without aura, intractable, with status migrainosus: Secondary | ICD-10-CM

## 2023-01-06 MED ORDER — SUMATRIPTAN SUCCINATE 100 MG PO TABS: ORAL_TABLET | ORAL | 0 refills | Status: DC

## 2023-02-27 ENCOUNTER — Ambulatory Visit: Payer: Self-pay

## 2023-02-27 NOTE — Progress Notes (Deleted)
    NURSE VISIT NOTE  Subjective:    Patient ID: Brenda Watson, female    DOB: 07-16-2001, 22 y.o.   MRN: [id number]  HPI  Patient is a 22 y.o. G0P0000 female who presents for depo provera injection.   Objective:    There were no vitals taken for this visit.  Last Annual: 05/24/22. Last pap: n/a due to age. Last Depo-Provera: 12/05/22. Side Effects if any: {NONE:21772}***. Serum HCG indicated? {YES/NO:21197}. Depo-Provera 150 mg IM given by: {AOB Clinical VFIEP:32951}. Site: {AOB INJ [id number]  Lab Review  @THIS  VISIT ONLY@  Assessment:   1. Encounter for management and injection of depo-Provera      Plan:   Next appointment due between 05/15/23 and 05/29/23.    Loman Chroman, CMA

## 2023-03-06 ENCOUNTER — Ambulatory Visit (INDEPENDENT_AMBULATORY_CARE_PROVIDER_SITE_OTHER): Payer: Self-pay

## 2023-03-06 VITALS — BP 117/79 | HR 86 | Ht 61.0 in | Wt 168.3 lb

## 2023-03-06 DIAGNOSIS — Z3042 Encounter for surveillance of injectable contraceptive: Secondary | ICD-10-CM

## 2023-03-06 MED ORDER — MEDROXYPROGESTERONE ACETATE 150 MG/ML IM SUSP
150.0000 mg | Freq: Once | INTRAMUSCULAR | Status: AC
  Administered 2023-03-06: 150 mg via INTRAMUSCULAR

## 2023-03-06 NOTE — Progress Notes (Signed)
    NURSE VISIT NOTE  Subjective:    Patient ID: Brenda Watson, female    DOB: Jan 06, 2001, 22 y.o.   MRN: [id number]  HPI  Patient is a 22 y.o. G0P0000 female who presents for depo provera injection.   Objective:    BP 117/79   Pulse 86   Ht 5\' 1"  (1.549 m)   Wt 168 lb 4.8 oz (76.3 kg)   BMI 31.80 kg/m   Last Annual: 05/24/22. Last pap: n/a due to age. Last Depo-Provera: 12/05/22. Side Effects if any: n/a. Serum HCG indicated? No . Depo-Provera 150 mg IM given by: Georgiana Shore, CMA. Site: [place]   Assessment:   1. Encounter for management and injection of depo-Provera      Plan:   Next appointment due between 05/22/23 and 06/05/23.    Loman Chroman, CMA

## 2023-04-09 NOTE — Progress Notes (Deleted)
Established Patient Office Visit  Subjective    Patient ID: Brenda Watson, female    DOB: [birth year]  Age: 22 y.o. MRN: [id number]  CC:  No chief complaint on file.   HPI Brenda Watson presents for follow up.    Anxiety: -Mood status: stable -Current treatment: Prozac increased to 20 mg at LOV but hasn't really been taking it, uncertain if it or the Propanolol is making her tired. She has a rigorous schedule with school and work.  -Failed treatments: Zoloft 25 mg previously but has a history of previous overdose associated with this medication in the past  Psychotherapy/counseling: no  Previous psychiatric medications: zoloft - had been on 50 mg in 2021 and did well but remembered she had an OD event with this medication  Depressed mood: no Anxious mood: yes     01/06/2023    9:51 AM 09/19/2022   10:46 AM 08/08/2022   10:07 AM 06/23/2022   10:42 AM 05/26/2022   11:18 AM  Depression screen PHQ 2/9  Decreased Interest 2 0 2 0 0  Down, Depressed, Hopeless 1 0 1 0 0  PHQ - 2 Score 3 0 3 0 0  Altered sleeping 2 2 2  0 0  Tired, decreased energy 2 2 2  0 0  Change in appetite 0 3 1 0 0  Feeling bad or failure about yourself  0 0 0 0 0  Trouble concentrating 0 0 0 0 0  Moving slowly or fidgety/restless 0 0 0 0 0  Suicidal thoughts 0 0 0 0 0  PHQ-9 Score 7 7 8  0 0  Difficult doing work/chores Somewhat difficult Somewhat difficult Somewhat difficult Not difficult at all Not difficult at all   MIGRAINES Duration: years Onset: sudden Quality: sharp, dull, and aching Frequency: a few times a week - unchanged but not taking Propanolol currently  Headache duration: Hours to all day Alleviating factors: Rest. Imitrex 100 mg as needed working well as an abortive agent for her  Aggravating factors: Bright lights, loud sounds and smells. Pork.  Headache status at time of visit: asymptomatic Treatments attempted: Treatments attempted: rest, ibuprofen, and aleve", excedrine  Imitrex  100 mg Aura: no Nausea:  no Vomiting: no Photophobia:  yes Phonophobia:  yes Mother and sister both have migraines and all 3 were patient at migraine clinic, however her sister was recently diagnosed with a brain tumor and they are no longer patients at the migraine clinic.   Outpatient Encounter Medications as of 04/10/2023  Medication Sig   FLUoxetine (PROZAC) 20 MG tablet Take 1 tablet (20 mg total) by mouth daily.   propranolol (INDERAL) 20 MG tablet Take 1 tablet (20 mg total) by mouth 2 (two) times daily.   SUMAtriptan (IMITREX) 100 MG tablet May repeat in 2 hours if headache persists or recurs. Do not take more than 2 doses in 24 hour period.   Facility-Administered Encounter Medications as of 04/10/2023  Medication   medroxyPROGESTERone (DEPO-PROVERA) injection 150 mg    Past Medical History:  Diagnosis Date   Menorrhagia    Protein C deficiency [facility])    mother states pt has deficiency. MD telephone call [facility] 12/06/17 says she does not but decreased levels on 2/19 labs   Vaccine for human papilloma virus (HPV) types 6, 11, 16, and 18 administered     Past Surgical History:  Procedure Laterality Date   TONSILLECTOMY Bilateral 09/04/2018   Procedure: TONSILLECTOMY;  Surgeon: Geanie Logan, MD;  Location: [facility];  Service: ENT;  Laterality: Bilateral;    Family History  Problem Relation Age of Onset   Protein C deficiency Mother    Migraines Mother    Cancer Sister 63       leoblastoma   Ovarian cancer Maternal Aunt 42       vs uterine   Hypertension Maternal Grandmother    Asthma Paternal Grandmother     Social History   Socioeconomic History   Marital status: Single    Spouse name: Not on file   Number of children: Not on file   Years of education: Not on file   Highest education level: Not on file  Occupational History   Not on file  Tobacco Use   Smoking status: Never   Smokeless tobacco: Never  Vaping Use   Vaping status: Former  Substance  and Sexual Activity   Alcohol use: No   Drug use: Yes    Types: Marijuana   Sexual activity: Yes    Birth control/protection: Injection    Comment: c females  Other Topics Concern   Not on file  Social History Narrative   Not on file   Social Determinants of Health   Financial Resource Strain: Not on file  Food Insecurity: Not on file  Transportation Needs: Not on file  Physical Activity: Not on file  Stress: Not on file  Social Connections: Unknown (01/17/2022)   Received from [facility], [facility]   Social Network    Social Network: Not on file  Intimate Partner Violence: Unknown (12/10/2021)   Received from [facility], [facility]   HITS    Physically Hurt: Not on file    Insult or Talk Down To: Not on file    Threaten Physical Harm: Not on file    Scream or Curse: Not on file    Review of Systems  Constitutional:  Negative for chills and fever.  Respiratory:  Negative for shortness of breath.   Cardiovascular:  Negative for chest pain.  Gastrointestinal:  Negative for nausea.  Neurological:  Positive for headaches.      Objective    There were no vitals taken for this visit.  Physical Exam Constitutional:      Appearance: Normal appearance.  HENT:     Head: Normocephalic and atraumatic.  Eyes:     Conjunctiva/sclera: Conjunctivae normal.  Cardiovascular:     Rate and Rhythm: Normal rate and regular rhythm.  Pulmonary:     Effort: Pulmonary effort is normal.     Breath sounds: Normal breath sounds.  Skin:    General: Skin is warm and dry.  Neurological:     General: No focal deficit present.     Mental Status: She is alert. Mental status is at baseline.  Psychiatric:        Mood and Affect: Mood normal.        Behavior: Behavior normal.       Assessment & Plan:   1. Intractable migraine without aura and with status migrainosus: Still having 2-3 times a week but not taking Propanolol due to fatigue. Recommend taking both Propanolol and  Prozac at night, if fatigue continues will discontinue Propanolol and try a different preventative. Imitrex 100 mg PRN refilled.  - SUMAtriptan (IMITREX) 100 MG tablet; May repeat in 2 hours if headache persists or recurs. Do not take more than 2 doses in 24 hour period.  Dispense: 30 tablet; Refill: 0  2. Anxiety: Continue Prozac 20 mg at night, does  feel better when she takes it.    No follow-ups on file.   Margarita Mail, DO

## 2023-04-10 ENCOUNTER — Ambulatory Visit: Payer: Self-pay

## 2023-04-10 ENCOUNTER — Ambulatory Visit: Payer: 59 | Admitting: Internal Medicine

## 2023-07-31 ENCOUNTER — Ambulatory Visit (INDEPENDENT_AMBULATORY_CARE_PROVIDER_SITE_OTHER): Payer: 59 | Admitting: Physician Assistant

## 2023-07-31 ENCOUNTER — Encounter: Payer: Self-pay | Admitting: Physician Assistant

## 2023-07-31 VITALS — BP 118/80 | HR 91 | Temp 98.2°F | Resp 16 | Ht 61.75 in | Wt 179.9 lb

## 2023-07-31 DIAGNOSIS — M545 Low back pain, unspecified: Secondary | ICD-10-CM | POA: Diagnosis not present

## 2023-07-31 MED ORDER — MELOXICAM 7.5 MG PO TABS: 7.5000 mg | ORAL_TABLET | Freq: Every day | ORAL | 0 refills | Status: DC

## 2023-07-31 NOTE — Patient Instructions (Signed)
Based on your symptoms and physical exam I believe the following is the cause of your concern today Back pain likely secondary to a strain of your back muscles  I recommend the following at this time to help relieve that discomfort:  Rest Warm compresses to the area (20 minutes on, minimum of 30 minutes off) You can alternate Tylenol and Ibuprofen for pain management but Ibuprofen is typically preferred to reduce inflammation.  I have sent in a script for Meloxicam 7.5 mg for you to take once per day (please do not take other NSAIDs while taking this medication) You can still use Tylenol as needed  Gentle stretches and exercises that I have included in your paperwork Try to reduce excess strain to the area and rest as much as possible  Wear supportive shoes and, if you must lift anything, use proper lifting techniques that spare your back.   If these measures do not lead to improvement in your symptoms over the next 2-4 weeks please let us know

## 2023-07-31 NOTE — Progress Notes (Signed)
Acute Office Visit   Patient: Brenda Watson   DOB: 2000/09/21   22 y.o. Female  MRN: [id number] Visit Date: 07/31/2023  Today's healthcare provider: Oswaldo Conroy Cortavious Nix, PA-C  Introduced myself to the patient as a Secondary school teacher and provided education on APPs in clinical practice.    Chief Complaint  Patient presents with   Back Pain    Lower bilateral onset for 2 weeks, pt states has been told previously has arthritis and scoliosis. Pt states she feels like spasms in her back   Subjective    HPI HPI     Back Pain    Additional comments: Lower bilateral onset for 2 weeks, pt states has been told previously has arthritis and scoliosis. Pt states she feels like spasms in her back      Last edited by Forde Radon, CMA on 07/31/2023 11:33 AM.        Low back pain   Onset: sudden (she was at home when it started )  She denies recent trauma but does do some lifting for work (max 50 lbs)  Duration: ongoing for about 2 weeks  Location: lower back - bilateral Radiation: sometimes has spasms along thoracic back  Pain level and character: Last Friday was 9/10. Today it's 3/10  Usually sharp in nature   Other associated symptoms: she denies numbness or tingling in legs or feet  Interventions: She has taken Exedrin tylenol to help with pain Alleviating: She has tried using heating pad but still had spasms  Aggravating: unsure    Medications: Outpatient Medications Prior to Visit  Medication Sig   FLUoxetine (PROZAC) 20 MG tablet Take 1 tablet (20 mg total) by mouth daily.   propranolol (INDERAL) 20 MG tablet Take 1 tablet (20 mg total) by mouth 2 (two) times daily.   SUMAtriptan (IMITREX) 100 MG tablet May repeat in 2 hours if headache persists or recurs. Do not take more than 2 doses in 24 hour period.   No facility-administered medications prior to visit.    Review of Systems  Constitutional:  Negative for chills, fatigue and fever.  Musculoskeletal:  Positive  for back pain.  Neurological:  Positive for headaches (has hx of migraines). Negative for tremors, weakness and numbness.        Objective    BP 118/80   Pulse 91   Temp 98.2 F (36.8 C) (Oral)   Resp 16   Ht 5' 1.75" (1.568 m)   Wt 179 lb 14.4 oz (81.6 kg)   SpO2 96%   BMI 33.17 kg/m     Physical Exam Vitals reviewed.  Constitutional:      General: She is awake.     Appearance: Normal appearance. She is well-developed and well-groomed.  HENT:     Head: Normocephalic and atraumatic.  Eyes:     General: Lids are normal. Gaze aligned appropriately.     Extraocular Movements: Extraocular movements intact.     Conjunctiva/sclera: Conjunctivae normal.  Pulmonary:     Effort: Pulmonary effort is normal.  Musculoskeletal:     Cervical back: Normal range of motion and neck supple.     Lumbar back: Tenderness present. No swelling, deformity or spasms. Normal range of motion. Negative right straight leg raise test and negative left straight leg raise test.       Back:     Comments: No obvious step-offs or deformities to palpation  ROM findings Thoracic: Lateral flexion and lateral rotation are  intact and symmetrical Lumbar: Extension, flexion are intact Hips: Extension, flexion, abduction, adduction are symmetrical and intact She reports mild pulling sensation in lower back with left sided hip adduction and abduction   Neurological:     Mental Status: She is alert.  Psychiatric:        Behavior: Behavior is cooperative.       No results found for any visits on 07/31/23.  Assessment & Plan      No follow-ups on file.      Problem List Items Addressed This Visit   None Visit Diagnoses     Acute bilateral low back pain without sciatica    -  Primary   Relevant Medications   meloxicam (MOBIC) 7.5 MG tablet      Acute, new concern Patient reports ongoing lower back pain for the past 2 weeks that is not improving with home measures Suspect muscular etiology  given overall intact ROM and mild tenderness to palpation Will start with conservative management which includes meloxicam 7.5 mg p.o. daily, Tylenol as needed, warm compresses, gentle stretches and massage If symptoms or not improving in the next 4 to 6 weeks patient should return to office and we will initiate imaging as well as physical therapy referral as indicated Follow-up as needed for progressing or persistent symptoms   No follow-ups on file.   I, Baruch Lewers E Emmelia Holdsworth, PA-C, have reviewed all documentation for this visit. The documentation on 07/31/23 for the exam, diagnosis, procedures, and orders are all accurate and complete.   Jacquelin Hawking, MHS, PA-C [facility]

## 2023-08-14 ENCOUNTER — Ambulatory Visit: Payer: Self-pay

## 2023-08-16 NOTE — Progress Notes (Signed)
Name: Brenda Watson   MRN: [id number]    DOB: Aug 16, 2001   Date:08/21/2023       Progress Note  Subjective  Chief Complaint  Chief Complaint  Patient presents with   Annual Exam    HPI  Patient presents for annual CPE.  Diet: Regular, well balanced  Exercise: None  Last Eye Exam: declined Last Dental Exam: Has a dentist, hasn't been seen in 1 year  Flowsheet Row Office Visit from 08/21/2023 in [facility]  AUDIT-C Score 0      Depression: Phq 9 is  positive    08/21/2023    8:30 AM 07/31/2023   11:27 AM 01/06/2023    9:51 AM 09/19/2022   10:46 AM 08/08/2022   10:07 AM  Depression screen PHQ 2/9  Decreased Interest 0 0 2 0 2  Down, Depressed, Hopeless 0 0 1 0 1  PHQ - 2 Score 0 0 3 0 3  Altered sleeping 0 0 2 2 2   Tired, decreased energy 0 0 2 2 2   Change in appetite 0 0 0 3 1  Feeling bad or failure about yourself  0 0 0 0 0  Trouble concentrating 0 0 0 0 0  Moving slowly or fidgety/restless 0 0 0 0 0  Suicidal thoughts 0 0 0 0 0  PHQ-9 Score 0 0 7 7 8   Difficult doing work/chores Not difficult at all Not difficult at all Somewhat difficult Somewhat difficult Somewhat difficult   Hypertension: BP Readings from Last 3 Encounters:  07/31/23 118/80  03/06/23 117/79  01/06/23 110/64   Obesity: Wt Readings from Last 3 Encounters:  08/21/23 185 lb 8 oz (84.1 kg)  07/31/23 179 lb 14.4 oz (81.6 kg)  03/06/23 168 lb 4.8 oz (76.3 kg)   BMI Readings from Last 3 Encounters:  08/21/23 34.20 kg/m  07/31/23 33.17 kg/m  03/06/23 31.80 kg/m     Vaccines:  RSV: not applicable HPV: completed Tdap:  UTD Shingrix: not applicable Pneumonia: not applicable Flu: declined COVID-19: declined  Hep C Screening: completed 01/01/2020 STD testing and prevention (HIV/chl/gon/syphilis): UTD 2021 Intimate partner violence: negative screen  LMP: On Depo but last injection was in June, does not have periods on the Depo, follows with Gynecology. Not  sexually active Discussed importance of follow up if any post-menopausal bleeding: not applicable  Incontinence Symptoms: positive for symptoms   Breast cancer:  - Last Mammogram: NA  Osteoporosis Prevention : Discussed high calcium and vitamin D supplementation, weight bearing exercises Bone density :not applicable   Cervical cancer screening: not applicable due to age - discussed starting screening next year  Skin cancer: Discussed monitoring for atypical lesions  Colorectal cancer: NA   Lung cancer:  Low Dose CT Chest recommended if Age 53-80 years, 20 pack-year currently smoking OR have quit w/in 15years. Patient does not qualify for screen   ECG:   Advanced Care Planning: A voluntary discussion about advance care planning including the explanation and discussion of advance directives.  Discussed health care proxy and Living will, and the patient was able to identify a health care proxy as [name] (mother).  Patient does not have a living will and power of attorney of health care   Patient Active Problem List   Diagnosis Date Noted   Family history of ovarian cancer 01/01/2020   DMDD (disruptive mood dysregulation disorder) (HCC) 11/13/2019   Intentional drug overdose (HCC) 11/11/2019   Menometrorrhagia 10/01/2018   Protein C deficiency (HCC) 10/01/2018  Past Surgical History:  Procedure Laterality Date   TONSILLECTOMY Bilateral 09/04/2018   Procedure: TONSILLECTOMY;  Surgeon: Geanie Logan, MD;  Location: [facility];  Service: ENT;  Laterality: Bilateral;    Family History  Problem Relation Age of Onset   Protein C deficiency Mother    Migraines Mother    Cancer Sister 63       leoblastoma   Ovarian cancer Maternal Aunt 42       vs uterine   Hypertension Maternal Grandmother    Asthma Paternal Grandmother     Social History   Socioeconomic History   Marital status: Single    Spouse name: Not on file   Number of children: Not on file   Years of  education: Not on file   Highest education level: Not on file  Occupational History   Not on file  Tobacco Use   Smoking status: Never   Smokeless tobacco: Never  Vaping Use   Vaping status: Former  Substance and Sexual Activity   Alcohol use: No   Drug use: Yes    Types: Marijuana   Sexual activity: Yes    Birth control/protection: Injection    Comment: c females  Other Topics Concern   Not on file  Social History Narrative   Not on file   Social Drivers of Health   Financial Resource Strain: Low Risk  (08/21/2023)   Overall Financial Resource Strain (CARDIA)    Difficulty of Paying Living Expenses: Not hard at all  Food Insecurity: No Food Insecurity (08/21/2023)   Hunger Vital Sign    Worried About Running Out of Food in the Last Year: Never true    Ran Out of Food in the Last Year: Never true  Transportation Needs: No Transportation Needs (08/21/2023)   PRAPARE - Administrator, Civil Service (Medical): No    Lack of Transportation (Non-Medical): No  Physical Activity: Inactive (08/21/2023)   Exercise Vital Sign    Days of Exercise per Week: 0 days    Minutes of Exercise per Session: 0 min  Stress: No Stress Concern Present (08/21/2023)   [employer] of Occupational Health - Occupational Stress Questionnaire    Feeling of Stress : Only a little  Social Connections: Unknown (01/17/2022)   Received from [facility]   Social Network    Social Network: Not on file  Intimate Partner Violence: Not At Risk (08/21/2023)   Humiliation, Afraid, Rape, and Kick questionnaire    Fear of Current or Ex-Partner: No    Emotionally Abused: No    Physically Abused: No    Sexually Abused: No     Current Outpatient Medications:    FLUoxetine (PROZAC) 20 MG tablet, Take 1 tablet (20 mg total) by mouth daily., Disp: 90 tablet, Rfl: 3   meloxicam (MOBIC) 7.5 MG tablet, Take 1 tablet (7.5 mg total) by mouth daily., Disp: 30 tablet, Rfl: 0    propranolol (INDERAL) 20 MG tablet, Take 1 tablet (20 mg total) by mouth 2 (two) times daily., Disp: 90 tablet, Rfl: 1   SUMAtriptan (IMITREX) 100 MG tablet, May repeat in 2 hours if headache persists or recurs. Do not take more than 2 doses in 24 hour period., Disp: 30 tablet, Rfl: 0  Allergies  Allergen Reactions   Pineapple     Mouth and throat get swollen   Pineapple Extract Other (See Comments)     Review of Systems  All other systems reviewed and are negative.  Objective  Vitals:   08/21/23 0825  Pulse: 86  Resp: 18  Temp: 98.3 F (36.8 C)  TempSrc: Oral  SpO2: 98%  Weight: 185 lb 8 oz (84.1 kg)  Height: 5' 1.75" (1.568 m)    Body mass index is 34.2 kg/m.  Physical Exam Constitutional:      Appearance: Normal appearance.  HENT:     Head: Normocephalic and atraumatic.     Mouth/Throat:     Mouth: Mucous membranes are moist.     Pharynx: Oropharynx is clear.  Eyes:     Extraocular Movements: Extraocular movements intact.     Conjunctiva/sclera: Conjunctivae normal.     Pupils: Pupils are equal, round, and reactive to light.  Neck:     Comments: No thyromegaly Cardiovascular:     Rate and Rhythm: Normal rate and regular rhythm.     Pulses:          Dorsalis pedis pulses are 2+ on the left side.  Pulmonary:     Effort: Pulmonary effort is normal.     Breath sounds: Normal breath sounds.  Musculoskeletal:     Cervical back: No tenderness.     Right lower leg: No edema.     Left lower leg: No edema.  Lymphadenopathy:     Cervical: No cervical adenopathy.  Skin:    General: Skin is warm and dry.  Neurological:     General: No focal deficit present.     Mental Status: She is alert. Mental status is at baseline.  Psychiatric:        Mood and Affect: Mood normal.        Behavior: Behavior normal.     Last CBC Lab Results  Component Value Date   WBC 3.9 05/26/2022   HGB 12.4 05/26/2022   HCT 37.0 05/26/2022   MCV 99.2 05/26/2022   MCH 33.2  (H) 05/26/2022   RDW 11.5 05/26/2022   PLT 239 05/26/2022   Last metabolic panel Lab Results  Component Value Date   GLUCOSE 79 05/26/2022   NA 139 05/26/2022   K 4.5 05/26/2022   CL 109 05/26/2022   CO2 24 05/26/2022   BUN 9 05/26/2022   CREATININE 0.80 05/26/2022   GFRNONAA >60 02/08/2022   CALCIUM 9.2 05/26/2022   PROT 7.8 02/08/2022   ALBUMIN 4.0 02/08/2022   BILITOT 0.5 02/08/2022   ALKPHOS 52 02/08/2022   AST 15 02/08/2022   ALT 12 02/08/2022   ANIONGAP 7 02/08/2022   Last lipids Lab Results  Component Value Date   CHOL 152 11/15/2019   HDL 54 11/15/2019   LDLCALC 89 11/15/2019   TRIG 44 11/15/2019   CHOLHDL 2.8 11/15/2019   Last hemoglobin A1c Lab Results  Component Value Date   HGBA1C 4.5 (L) 11/15/2019   Last thyroid functions Lab Results  Component Value Date   TSH 1.712 11/15/2019   Last vitamin D No results found for: "25OHVITD2", "25OHVITD3", "VD25OH" Last vitamin B12 and Folate No results found for: "VITAMINB12", "FOLATE"    Assessment & Plan  1. Well adult exam (Primary)/Screening examination for STD (sexually transmitted disease): Physical exam completed, health maintenance reviewed and annual labs ordered.   - CBC w/Diff/Platelet - COMPLETE METABOLIC PANEL WITH GFR - Cervicovaginal ancillary only  2. Anxiety: Moods stable, refill Prozac.   - FLUoxetine (PROZAC) 20 MG tablet; Take 1 tablet (20 mg total) by mouth daily.  Dispense: 90 tablet; Refill: 3   -USPSTF grade A and B recommendations reviewed with  patient; age-appropriate recommendations, preventive care, screening tests, etc discussed and encouraged; healthy living encouraged; see AVS for patient education given to patient -Discussed importance of 150 minutes of physical activity weekly, eat two servings of fish weekly, eat one serving of tree nuts ( cashews, pistachios, pecans, almonds.Marland Kitchen) every other day, eat 6 servings of fruit/vegetables daily and drink plenty of water and avoid  sweet beverages.   -Reviewed Health Maintenance: Yes.

## 2023-08-21 ENCOUNTER — Other Ambulatory Visit: Payer: Self-pay

## 2023-08-21 ENCOUNTER — Encounter: Payer: Self-pay | Admitting: Internal Medicine

## 2023-08-21 ENCOUNTER — Ambulatory Visit (INDEPENDENT_AMBULATORY_CARE_PROVIDER_SITE_OTHER): Payer: 59 | Admitting: Internal Medicine

## 2023-08-21 ENCOUNTER — Other Ambulatory Visit (HOSPITAL_COMMUNITY)
Admission: RE | Admit: 2023-08-21 | Discharge: 2023-08-21 | Disposition: A | Discharge status: Home / Self Care | Payer: 59 | Source: Ambulatory Visit | Attending: Internal Medicine | Admitting: Internal Medicine

## 2023-08-21 VITALS — BP 122/80 | HR 86 | Temp 98.3°F | Resp 18 | Ht 61.75 in | Wt 185.5 lb

## 2023-08-21 DIAGNOSIS — Z113 Encounter for screening for infections with a predominantly sexual mode of transmission: Secondary | ICD-10-CM | POA: Insufficient documentation

## 2023-08-21 DIAGNOSIS — F419 Anxiety disorder, unspecified: Secondary | ICD-10-CM | POA: Diagnosis not present

## 2023-08-21 DIAGNOSIS — Z0000 Encounter for general adult medical examination without abnormal findings: Secondary | ICD-10-CM

## 2023-08-21 MED ORDER — FLUOXETINE HCL 20 MG PO TABS: 20.0000 mg | ORAL_TABLET | Freq: Every day | ORAL | 3 refills | Status: DC

## 2023-08-22 LAB — COMPLETE METABOLIC PANEL WITH GFR
AG Ratio: 1.5 (calc) (ref 1.0–2.5)
ALT: 10 U/L (ref 6–29)
AST: 12 U/L (ref 10–30)
Albumin: 4.3 g/dL (ref 3.6–5.1)
Alkaline phosphatase (APISO): 87 U/L (ref 31–125)
BUN: 13 mg/dL (ref 7–25)
CO2: 24 mmol/L (ref 20–32)
Calcium: 9.4 mg/dL (ref 8.6–10.2)
Chloride: 106 mmol/L (ref 98–110)
Creat: 0.71 mg/dL (ref 0.50–0.96)
Globulin: 2.8 g/dL (ref 1.9–3.7)
Glucose, Bld: 86 mg/dL (ref 65–99)
Potassium: 4.7 mmol/L (ref 3.5–5.3)
Sodium: 138 mmol/L (ref 135–146)
Total Bilirubin: 0.5 mg/dL (ref 0.2–1.2)
Total Protein: 7.1 g/dL (ref 6.1–8.1)
eGFR: 125 mL/min/{1.73_m2} (ref >=60)

## 2023-08-22 LAB — CBC WITH DIFFERENTIAL/PLATELET
Absolute Lymphocytes: 2246 {cells}/uL (ref 850–3900)
Absolute Monocytes: 510 {cells}/uL (ref 200–950)
Basophils Absolute: 28 {cells}/uL (ref 0–200)
Basophils Relative: 0.5 %
Eosinophils Absolute: 140 {cells}/uL (ref 15–500)
Eosinophils Relative: 2.5 %
HCT: 38.9 % (ref 35.0–45.0)
Hemoglobin: 12.7 g/dL (ref 11.7–15.5)
MCH: 32.2 pg (ref 27.0–33.0)
MCHC: 32.6 g/dL (ref 32.0–36.0)
MCV: 98.5 fL (ref 80.0–100.0)
MPV: 11.2 fL (ref 7.5–12.5)
Monocytes Relative: 9.1 %
Neutro Abs: 2677 {cells}/uL (ref 1500–7800)
Neutrophils Relative %: 47.8 %
Platelets: 260 10*3/uL (ref 140–400)
RBC: 3.95 10*6/uL (ref 3.80–5.10)
RDW: 11.4 % (ref 11.0–15.0)
Total Lymphocyte: 40.1 %
WBC: 5.6 10*3/uL (ref 3.8–10.8)

## 2023-08-22 LAB — CERVICOVAGINAL ANCILLARY ONLY
Bacterial Vaginitis (gardnerella): NEGATIVE
Candida Glabrata: NEGATIVE
Candida Vaginitis: NEGATIVE
Chlamydia: NEGATIVE
Comment: NEGATIVE
Comment: NEGATIVE
Comment: NEGATIVE
Comment: NEGATIVE
Comment: NEGATIVE
Comment: NORMAL
Neisseria Gonorrhea: NEGATIVE
Trichomonas: NEGATIVE

## 2023-08-30 ENCOUNTER — Encounter: Payer: Self-pay | Admitting: Internal Medicine

## 2023-11-26 IMAGING — US US PELVIS COMPLETE
1 series · 14 of 25 positions shown · non-contrast
Comparison: None Available.

CLINICAL DATA: Vaginal bleeding

EXAM:
TRANSABDOMINAL ULTRASOUND OF PELVIS
TECHNIQUE: Transabdominal ultrasound examination of the pelvis was performed
including evaluation of the uterus, ovaries, adnexal regions, and
pelvic cul-de-sac.

[Series 1: us pelvic complete with transvaginal · 14 of 66 slices shown]
[im 1/66]
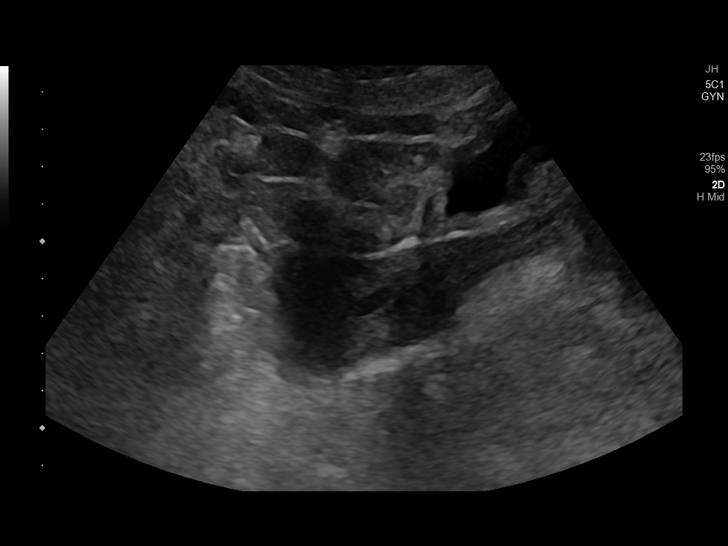
[im 6/66]
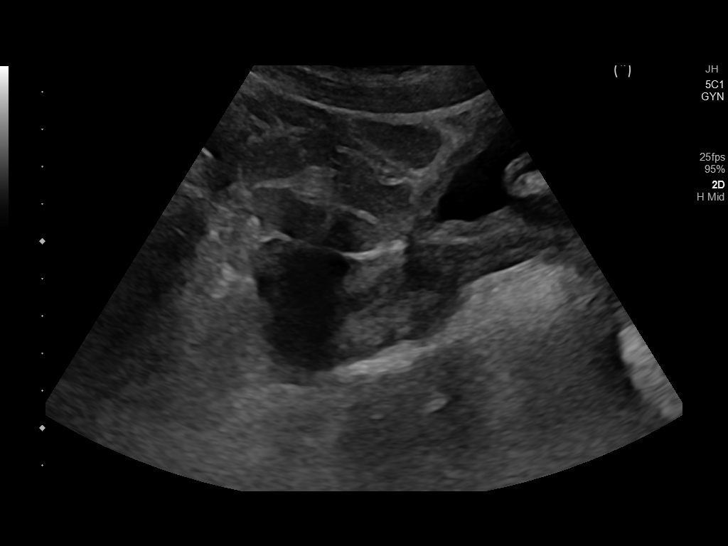
[im 11/66]
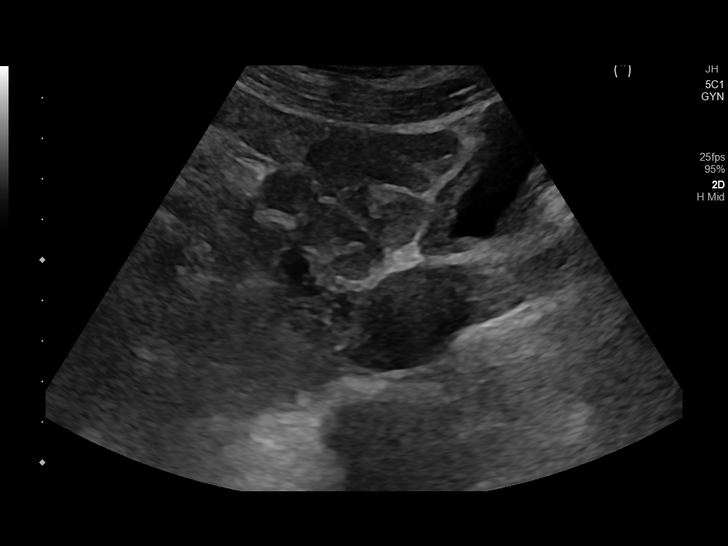
[im 17/66]
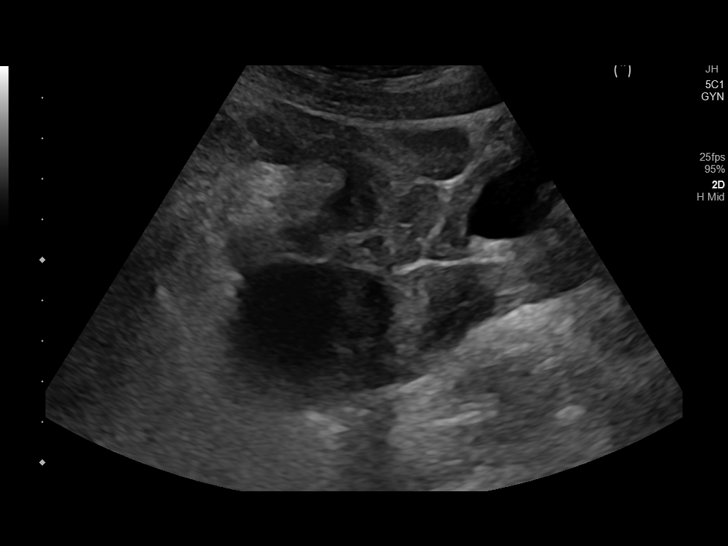
[im 22/66]
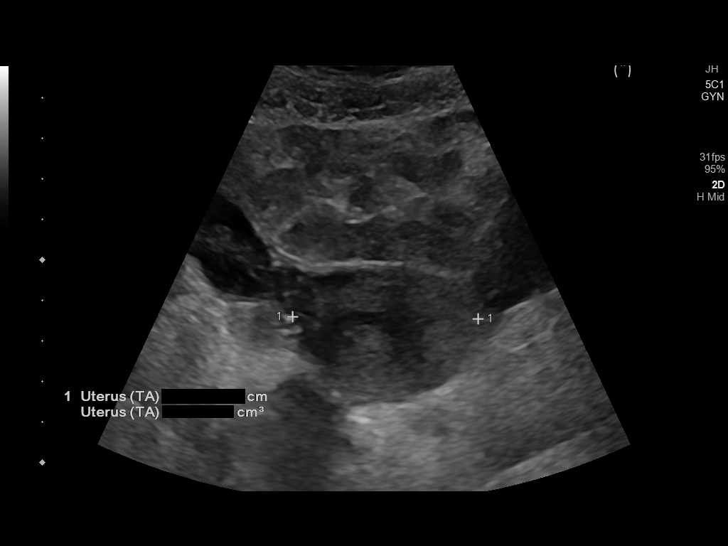
[im 25/66]
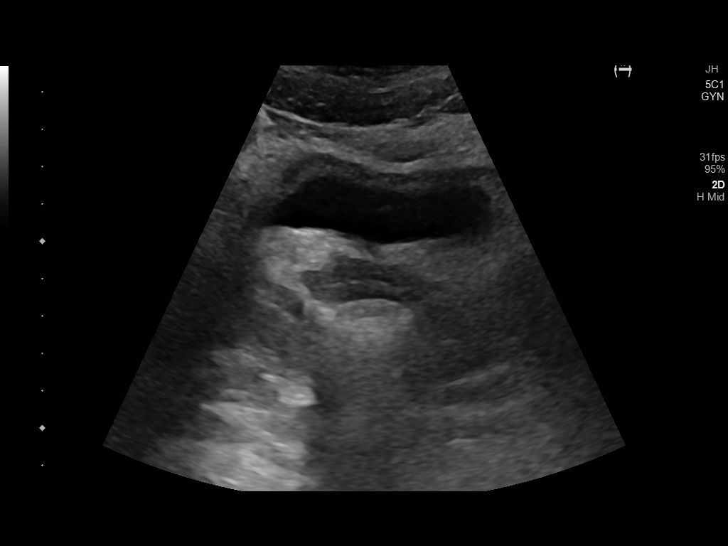
[im 30/66]
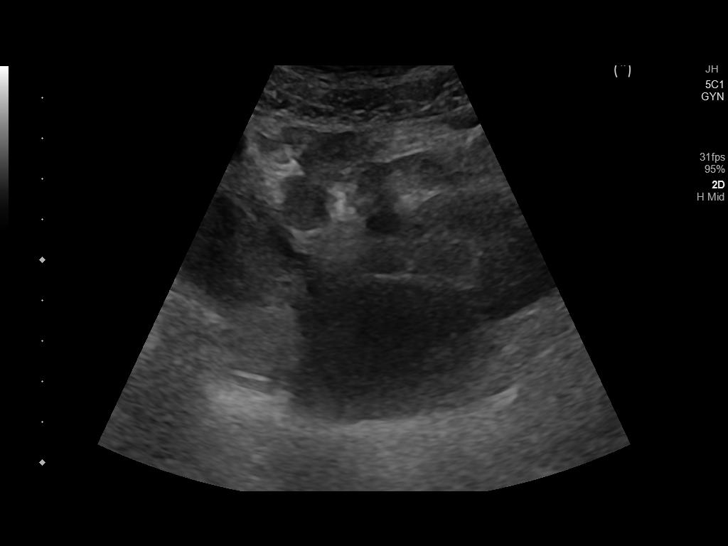
[im 36/66]
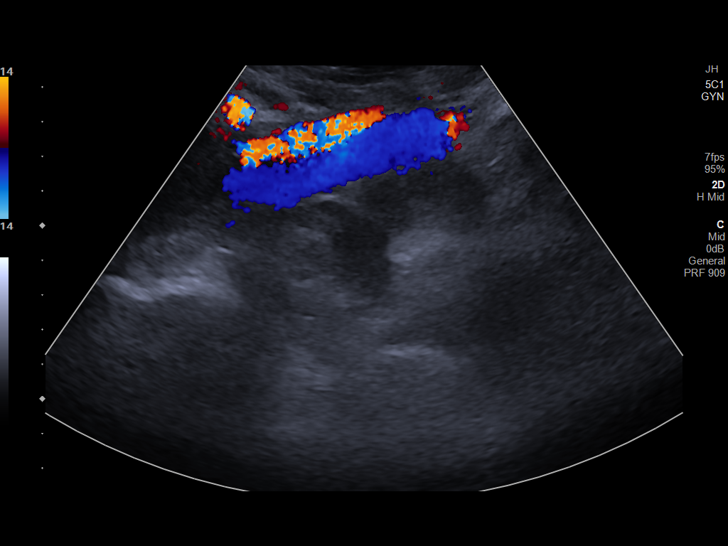
[im 41/66]
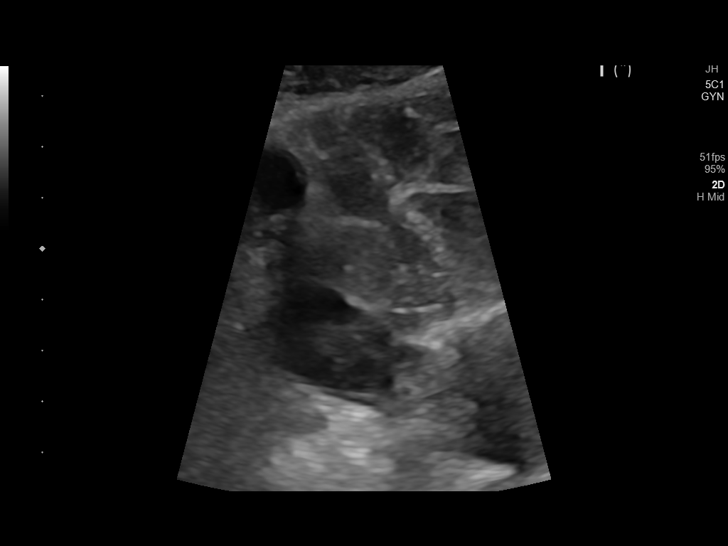
[im 44/66]
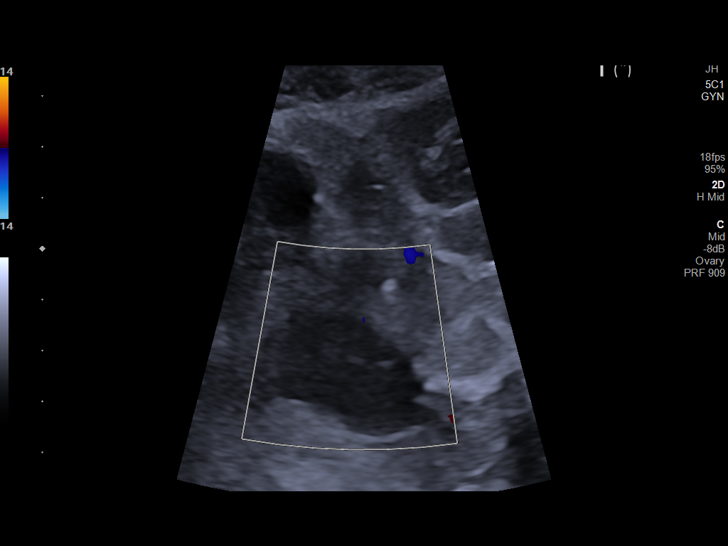
[im 49/66]
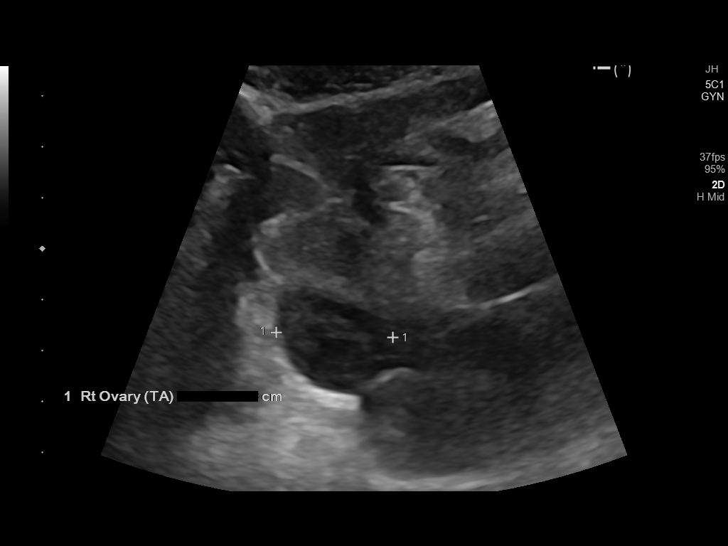
[im 55/66]
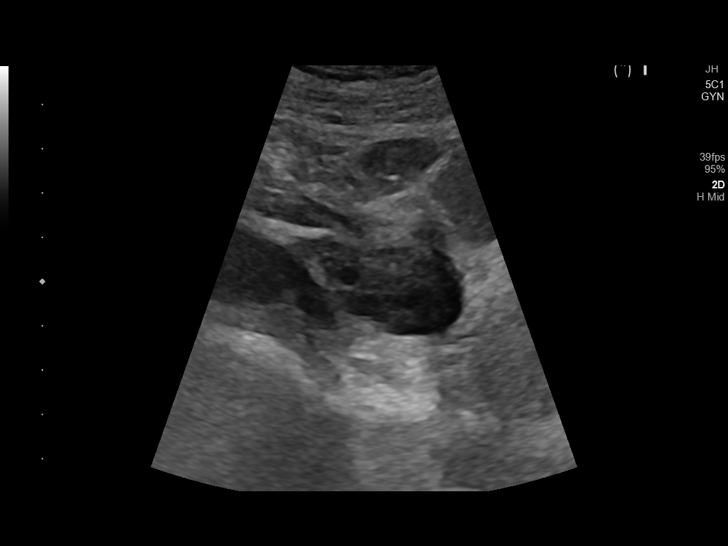
[im 60/66]
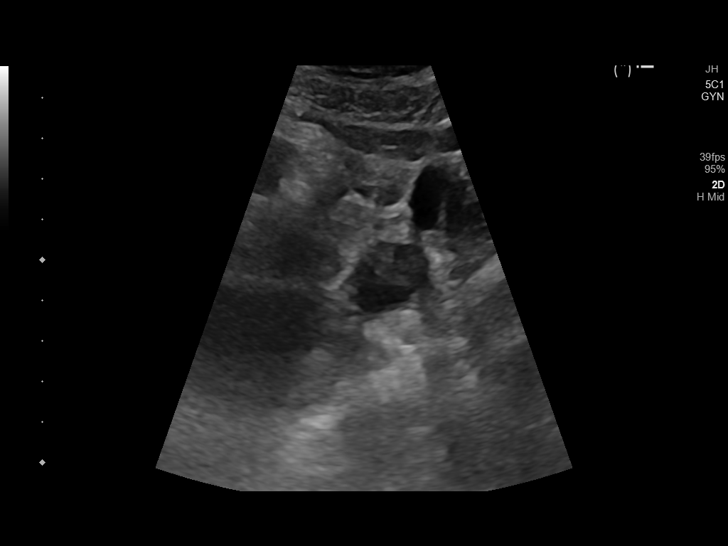
[im 66/66]
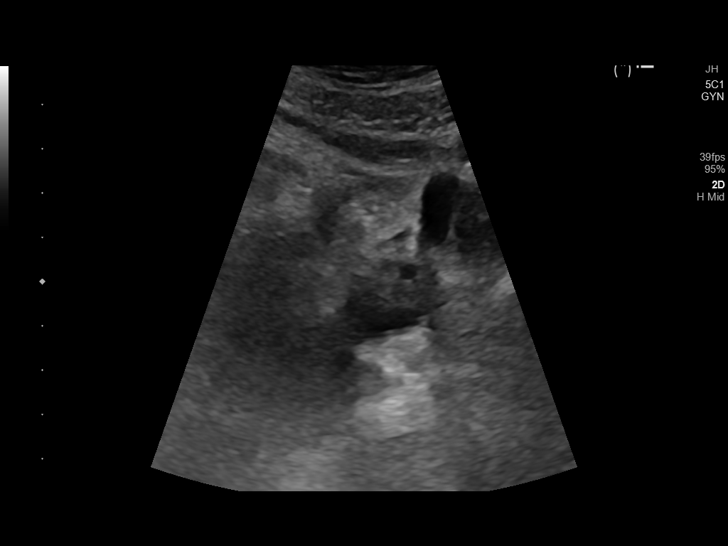

[14 of 25 positions shown; findings below may reference images not displayed]

FINDINGS: Uterus

Measurements: 5.5 x 3.6 x 4.6 cm = volume: 47.6 mL. There is
incomplete distention of the urinary bladder limiting evaluation of
myometrium and endometrium. According to the note by the
technologist, patient declined transvaginal examination.

Endometrium

Thickness: 3 mm. Not optimally visualized. As far as seen, there is
no abnormal thickening or increased vascularity in the endometrium.

Right ovary

Measurements: 3 x 1.9 x 2.3 cm = volume: 6.9 mL. Normal
appearance/no adnexal mass.

Left ovary

Measurements: 3.8 x 2.2 x 2.1 cm = volume: 9.38 mL. Normal
appearance/no adnexal mass.

Other findings:  No abnormal free fluid.
IMPRESSION: Technically limited study due to incomplete distention of the
urinary bladder in the transabdominal images and absence of
transvaginal evaluation. As far as seen no significant sonographic
abnormality is seen in the pelvis.

## 2023-12-06 ENCOUNTER — Encounter: Payer: Self-pay | Admitting: Emergency Medicine

## 2023-12-06 ENCOUNTER — Other Ambulatory Visit: Payer: Self-pay

## 2023-12-06 ENCOUNTER — Ambulatory Visit
Admission: EM | Admit: 2023-12-06 | Discharge: 2023-12-06 | Disposition: A | Discharge status: Home / Self Care | Attending: Emergency Medicine | Admitting: Emergency Medicine

## 2023-12-06 DIAGNOSIS — J069 Acute upper respiratory infection, unspecified: Secondary | ICD-10-CM | POA: Diagnosis not present

## 2023-12-06 LAB — POC COVID19/FLU A&B COMBO
Covid Antigen, POC: NEGATIVE
Influenza A Antigen, POC: NEGATIVE
Influenza B Antigen, POC: NEGATIVE

## 2023-12-06 NOTE — ED Triage Notes (Signed)
 Patient presents to [facility] for evaluation of runny nose, sore throat, body aches, fatigue, nausea, 1 episode of emesis, and headache since yesterday.  Denies known exposure

## 2023-12-06 NOTE — Discharge Instructions (Addendum)
Your symptoms today are most likely being caused by a virus and should steadily improve in time it can take up to 7 to 10 days before you truly start to see a turnaround however things will get better     Covid and flu negative  You can take Tylenol and/or Ibuprofen as needed for fever reduction and pain relief.   For cough: honey 1/2 to 1 teaspoon (you can dilute the honey in water or another fluid).  You can also use guaifenesin and dextromethorphan for cough. You can use a humidifier for chest congestion and cough.  If you don't have a humidifier, you can sit in the bathroom with the hot shower running.      For sore throat: try warm salt water gargles, cepacol lozenges, throat spray, warm tea or water with lemon/honey, popsicles or ice, or OTC cold relief medicine for throat discomfort.   For congestion: take a daily anti-histamine like Zyrtec, Claritin, and a oral decongestant, such as pseudoephedrine.  You can also use Flonase 1-2 sprays in each nostril daily.   It is important to stay hydrated: drink plenty of fluids (water, gatorade/powerade/pedialyte, juices, or teas) to keep your throat moisturized and help further relieve irritation/discomfort.  

## 2023-12-06 NOTE — ED Provider Notes (Signed)
 Brenda Watson    CSN: [id number] Arrival date & time: 12/06/23  1256      History   Chief Complaint Chief Complaint  Patient presents with   URI    HPI Brenda Watson is a 23 y.o. female.   Patient presents for evaluation of chills, generalized bodyaches, nasal congestion, rhinorrhea, sore throat, nausea with 1 episodes of vomitin diarrhea and intermittent headaches present for 1 day.  No known sick contact.  Poor appetite but able to tolerate some food and liquids.  Has attempted use of TheraFlu and Mucinex.  Past Medical History:  Diagnosis Date   Menorrhagia    Protein C deficiency [facility])    mother states pt has deficiency. MD telephone call [facility] 12/06/17 says she does not but decreased levels on 2/19 labs   Vaccine for human papilloma virus (HPV) types 6, 11, 16, and 18 administered     Patient Active Problem List   Diagnosis Date Noted   Family history of ovarian cancer 01/01/2020   DMDD (disruptive mood dysregulation disorder) (HCC) 11/13/2019   Intentional drug overdose (HCC) 11/11/2019   Menometrorrhagia 10/01/2018   Protein C deficiency (HCC) 10/01/2018    Past Surgical History:  Procedure Laterality Date   TONSILLECTOMY Bilateral 09/04/2018   Procedure: TONSILLECTOMY;  Surgeon: Geanie Logan, MD;  Location: [facility];  Service: ENT;  Laterality: Bilateral;    OB History     Gravida  0   Para  0   Term  0   Preterm  0   AB  0   Living  0      SAB  0   IAB  0   Ectopic  0   Multiple  0   Live Births  0            Home Medications    Prior to Admission medications   Medication Sig Start Date End Date Taking? Authorizing Provider  FLUoxetine (PROZAC) 20 MG tablet Take 1 tablet (20 mg total) by mouth daily. 08/21/23   Margarita Mail, DO  meloxicam (MOBIC) 7.5 MG tablet Take 1 tablet (7.5 mg total) by mouth daily. 07/31/23   Mecum, Erin E, PA-C  propranolol (INDERAL) 20 MG tablet Take 1 tablet (20 mg total) by  mouth 2 (two) times daily. 09/19/22   Margarita Mail, DO  SUMAtriptan (IMITREX) 100 MG tablet May repeat in 2 hours if headache persists or recurs. Do not take more than 2 doses in 24 hour period. 01/06/23   Margarita Mail, DO    Family History Family History  Problem Relation Age of Onset   Protein C deficiency Mother    Migraines Mother    Cancer Sister 44       leoblastoma   Ovarian cancer Maternal Aunt 42       vs uterine   Hypertension Maternal Grandmother    Asthma Paternal Grandmother     Social History Social History   Tobacco Use   Smoking status: Never   Smokeless tobacco: Never  Vaping Use   Vaping status: Former  Substance Use Topics   Alcohol use: No   Drug use: Yes    Types: Marijuana     Allergies   Pineapple and Pineapple extract   Review of Systems Review of Systems   Physical Exam Triage Vital Signs ED Triage Vitals  Encounter Vitals Group     BP 12/06/23 1322 121/72     Systolic BP Percentile --  Diastolic BP Percentile --      Pulse Rate 12/06/23 1322 69     Resp 12/06/23 1322 18     Temp 12/06/23 1322 99 F (37.2 C)     Temp Source 12/06/23 1322 Oral     SpO2 12/06/23 1322 97 %     Weight --      Height --      Head Circumference --      Peak Flow --      Pain Score 12/06/23 1323 6     Pain Loc --      Pain Education --      Exclude from Growth Chart --    No data found.  Updated Vital Signs BP 121/72 (BP Location: Left Arm)   Pulse 69   Temp 99 F (37.2 C) (Oral)   Resp 18   LMP 11/22/2023   SpO2 97%   Visual Acuity Right Eye Distance:   Left Eye Distance:   Bilateral Distance:    Right Eye Near:   Left Eye Near:    Bilateral Near:     Physical Exam Constitutional:      Appearance: She is ill-appearing.  HENT:     Head: Normocephalic.     Right Ear: Tympanic membrane, ear canal and external ear normal.     Left Ear: Tympanic membrane, ear canal and external ear normal.     Nose: Congestion present.  No rhinorrhea.     Mouth/Throat:     Mouth: Mucous membranes are moist.     Pharynx: Oropharynx is clear. No oropharyngeal exudate or posterior oropharyngeal erythema.  Eyes:     Extraocular Movements: Extraocular movements intact.  Cardiovascular:     Rate and Rhythm: Normal rate and regular rhythm.     Pulses: Normal pulses.     Heart sounds: Normal heart sounds.  Pulmonary:     Effort: Pulmonary effort is normal.     Breath sounds: Normal breath sounds.  Musculoskeletal:     Cervical back: Normal range of motion and neck supple.  Neurological:     Mental Status: She is alert and oriented to person, place, and time. Mental status is at baseline.      UC Treatments / Results  Labs (all labs ordered are listed, but only abnormal results are displayed) Labs Reviewed  POC COVID19/FLU A&B COMBO    EKG   Radiology No results found.  Procedures Procedures (including critical care time)  Medications Ordered in UC Medications - No data to display  Initial Impression / Assessment and Plan / UC Course  I have reviewed the triage vital signs and the nursing notes.  Pertinent labs & imaging results that were available during my care of the patient were reviewed by me and considered in my medical decision making (see chart for details).  Viral URI with cough  Patient is in no signs of distress nor toxic appearing.  Vital signs are stable.  Low suspicion for pneumonia, pneumothorax or bronchitis and therefore will defer imaging.  Covid and flu negative .May use additional over-the-counter medications as needed for supportive care.  May follow-up with urgent care as needed if symptoms persist or worsen.  Note given.   Final Clinical Impressions(s) / UC Diagnoses   Final diagnoses:  Viral URI with cough     Discharge Instructions      Your symptoms today are most likely being caused by a virus and should steadily improve in time it can take up  to 7 to 10 days before you  truly start to see a turnaround however things will get better    Covid and flu negative   You can take Tylenol and/or Ibuprofen as needed for fever reduction and pain relief.   For cough: honey 1/2 to 1 teaspoon (you can dilute the honey in water or another fluid).  You can also use guaifenesin and dextromethorphan for cough. You can use a humidifier for chest congestion and cough.  If you don't have a humidifier, you can sit in the bathroom with the hot shower running.      For sore throat: try warm salt water gargles, cepacol lozenges, throat spray, warm tea or water with lemon/honey, popsicles or ice, or OTC cold relief medicine for throat discomfort.   For congestion: take a daily anti-histamine like Zyrtec, Claritin, and a oral decongestant, such as pseudoephedrine.  You can also use Flonase 1-2 sprays in each nostril daily.   It is important to stay hydrated: drink plenty of fluids (water, gatorade/powerade/pedialyte, juices, or teas) to keep your throat moisturized and help further relieve irritation/discomfort.    ED Prescriptions   None    PDMP not reviewed this encounter.   Valinda Hoar, NP 12/06/23 1350

## 2023-12-07 ENCOUNTER — Ambulatory Visit: Payer: Self-pay

## 2023-12-18 ENCOUNTER — Ambulatory Visit

## 2023-12-18 VITALS — BP 106/65 | Ht 61.0 in | Wt 172.0 lb

## 2023-12-18 DIAGNOSIS — Z3202 Encounter for pregnancy test, result negative: Secondary | ICD-10-CM

## 2023-12-18 DIAGNOSIS — Z3042 Encounter for surveillance of injectable contraceptive: Secondary | ICD-10-CM | POA: Diagnosis not present

## 2023-12-18 LAB — POCT URINE PREGNANCY: Preg Test, Ur: NEGATIVE

## 2023-12-18 MED ORDER — MEDROXYPROGESTERONE ACETATE 150 MG/ML IM SUSY
150.0000 mg | PREFILLED_SYRINGE | Freq: Once | INTRAMUSCULAR | Status: AC
  Administered 2023-12-18: 150 mg via INTRAMUSCULAR

## 2023-12-18 NOTE — Progress Notes (Signed)
    NURSE VISIT NOTE  Subjective:    Patient ID: Brenda Watson, female    DOB: 2001-01-04, 23 y.o.   MRN: [id number]  HPI  Patient is a 23 y.o. G0P0000 female who presents for depo provera injection.   Objective:    BP 106/65   Ht 5\' 1"  (1.549 m)   Wt 172 lb (78 kg)   LMP 11/22/2023 (Approximate)   BMI 32.50 kg/m   Last Annual: 05/24/22. Last pap: N/A. Last Depo-Provera: 03/06/23. Side Effects if any: none. Serum HCG indicated? No . Depo-Provera 150 mg IM given by: Higinio Love, CMA. Site: Right Upper Outer Quandrant   Assessment:   1. Encounter for surveillance of injectable contraceptive      Plan:   Next appointment due between June 30 and July 14. Patient advised she is overdue for annual and needs to schedule.    Higinio Love, CMA

## 2023-12-18 NOTE — Patient Instructions (Signed)

## 2024-01-25 ENCOUNTER — Encounter: Payer: Self-pay | Admitting: Obstetrics and Gynecology

## 2024-01-25 ENCOUNTER — Other Ambulatory Visit (HOSPITAL_COMMUNITY)
Admission: RE | Admit: 2024-01-25 | Discharge: 2024-01-25 | Disposition: A | Discharge status: Home / Self Care | Source: Ambulatory Visit | Attending: Obstetrics and Gynecology | Admitting: Obstetrics and Gynecology

## 2024-01-25 ENCOUNTER — Ambulatory Visit (INDEPENDENT_AMBULATORY_CARE_PROVIDER_SITE_OTHER): Admitting: Obstetrics and Gynecology

## 2024-01-25 VITALS — BP 124/73 | HR 74 | Ht 61.0 in | Wt 163.0 lb

## 2024-01-25 DIAGNOSIS — Z113 Encounter for screening for infections with a predominantly sexual mode of transmission: Secondary | ICD-10-CM

## 2024-01-25 DIAGNOSIS — Z01419 Encounter for gynecological examination (general) (routine) without abnormal findings: Secondary | ICD-10-CM

## 2024-01-25 DIAGNOSIS — Z3042 Encounter for surveillance of injectable contraceptive: Secondary | ICD-10-CM

## 2024-01-25 DIAGNOSIS — N921 Excessive and frequent menstruation with irregular cycle: Secondary | ICD-10-CM

## 2024-01-25 DIAGNOSIS — Z124 Encounter for screening for malignant neoplasm of cervix: Secondary | ICD-10-CM | POA: Diagnosis present

## 2024-01-25 MED ORDER — MEDROXYPROGESTERONE ACETATE 150 MG/ML IM SUSP: 150.0000 mg | INTRAMUSCULAR | Status: AC

## 2024-01-25 NOTE — Patient Instructions (Signed)
 I value your feedback and you entrusting [place] with your care. If you get a [place] patient survey, I would appreciate you taking the time to let us know about your experience today. Thank you! ? ? ?

## 2024-01-25 NOTE — Progress Notes (Signed)
 PCP:  Rockney Cid, DO   Chief Complaint  Patient presents with   Vaginal Bleeding    Started two weeks ago, with moderate pain. Usually no bleeding with depo.     HPI:      [name] is a 23 y.o. G0P0000 whose LMP was Patient's last menstrual period was 01/11/2024 (approximate)., presents today for her annual examination.  Her menses are usually absent with depo, no BTB/dysmen. Started with bleeding 2 wks ago, mod flow, with dysmen,  somewhat improved with NSAIDs. Bleeding and pain stopped yesterday, still having spotting today. Started depo in past for menorrhagia and hx of Protein C def with good sx control. Last depo given 12/18/23. Mom with leio/ no FH endometriosis.   Sex activity: no IC/penetration, contraception - Depo-Provera  injections. No vag sx.  Last Pap: N/A due to age Hx of STDs: none  There is no FH of breast cancer. There is a FH of ovarian cancer in her mat aunt; pt's mom is MyRisk neg. The patient does not do self-breast exams.  Tobacco use: The patient denies current or previous tobacco use. Alcohol use: social drinker No drug use.  Exercise: moderately active  She does not get adequate calcium and Vitamin D in her diet. Gardasil completed.   Patient Active Problem List   Diagnosis Date Noted   Family history of ovarian cancer 01/01/2020   DMDD (disruptive mood dysregulation disorder) (HCC) 11/13/2019   Intentional drug overdose (HCC) 11/11/2019   Menometrorrhagia 10/01/2018   Protein C deficiency (HCC) 10/01/2018    Past Surgical History:  Procedure Laterality Date   TONSILLECTOMY Bilateral 09/04/2018   Procedure: TONSILLECTOMY;  Surgeon: Von Grumbling, MD;  Location: [facility];  Service: ENT;  Laterality: Bilateral;    Family History  Problem Relation Age of Onset   Protein C deficiency Mother    Migraines Mother    Cancer Sister 24       leoblastoma   Ovarian cancer Maternal Aunt 42       vs uterine   Hypertension  Maternal Grandmother    Asthma Paternal Grandmother     Social History   Socioeconomic History   Marital status: Single    Spouse name: Not on file   Number of children: Not on file   Years of education: Not on file   Highest education level: Not on file  Occupational History   Not on file  Tobacco Use   Smoking status: Never   Smokeless tobacco: Never  Vaping Use   Vaping status: Former  Substance and Sexual Activity   Alcohol use: No   Drug use: Yes    Types: Marijuana   Sexual activity: Yes    Birth control/protection: Injection    Comment: c females  Other Topics Concern   Not on file  Social History Narrative   Not on file   Social Drivers of Health   Financial Resource Strain: Low Risk  (08/21/2023)   Overall Financial Resource Strain (CARDIA)    Difficulty of Paying Living Expenses: Not hard at all  Food Insecurity: No Food Insecurity (08/21/2023)   Hunger Vital Sign    Worried About Running Out of Food in the Last Year: Never true    Ran Out of Food in the Last Year: Never true  Transportation Needs: No Transportation Needs (08/21/2023)   PRAPARE - Administrator, Civil Service (Medical): No    Lack of Transportation (Non-Medical): No  Physical Activity: Inactive (08/21/2023)  Exercise Vital Sign    Days of Exercise per Week: 0 days    Minutes of Exercise per Session: 0 min  Stress: No Stress Concern Present (08/21/2023)   [employer] of Occupational Health - Occupational Stress Questionnaire    Feeling of Stress : Only a little  Social Connections: Unknown (01/17/2022)   [facility]   Social Network    Social Network: Not on file  Intimate Partner Violence: Not At Risk (08/21/2023)   Humiliation, Afraid, Rape, and Kick questionnaire    Fear of Current or Ex-Partner: No    Emotionally Abused: No    Physically Abused: No    Sexually Abused: No     Current Outpatient Medications:    FLUoxetine   (PROZAC ) 20 MG tablet, Take 1 tablet (20 mg total) by mouth daily., Disp: 90 tablet, Rfl: 3   meloxicam  (MOBIC ) 7.5 MG tablet, Take 1 tablet (7.5 mg total) by mouth daily., Disp: 30 tablet, Rfl: 0   SUMAtriptan  (IMITREX ) 100 MG tablet, May repeat in 2 hours if headache persists or recurs. Do not take more than 2 doses in 24 hour period., Disp: 30 tablet, Rfl: 0  Current Facility-Administered Medications:    [START ON 03/11/2024] medroxyPROGESTERone  (DEPO-PROVERA ) injection 150 mg, 150 mg, Intramuscular, Q90 days,      ROS:  Review of Systems  Constitutional:  Negative for fatigue, fever and unexpected weight change.  Respiratory:  Negative for cough, shortness of breath and wheezing.   Cardiovascular:  Negative for chest pain, palpitations and leg swelling.  Gastrointestinal:  Negative for blood in stool, constipation, diarrhea, nausea and vomiting.  Endocrine: Negative for cold intolerance, heat intolerance and polyuria.  Genitourinary:  Positive for pelvic pain and vaginal bleeding. Negative for dyspareunia, dysuria, flank pain, frequency, genital sores, hematuria, menstrual problem, urgency, vaginal discharge and vaginal pain.  Musculoskeletal:  Negative for back pain, joint swelling and myalgias.  Skin:  Negative for rash.  Neurological:  Negative for dizziness, syncope, light-headedness, numbness and headaches.  Hematological:  Negative for adenopathy.  Psychiatric/Behavioral:  Negative for agitation, confusion, sleep disturbance and suicidal ideas. The patient is not nervous/anxious.    BREAST: No symptoms   Objective: BP 124/73   Pulse 74   Ht 5\' 1"  (1.549 m)   Wt 163 lb (73.9 kg)   LMP 01/11/2024 (Approximate)   BMI 30.80 kg/m    Physical Exam Constitutional:      Appearance: She is well-developed.  Genitourinary:     Vulva normal.     Genitourinary Comments: BROWN VAG D/C; VIRGIN     Right Labia: No rash, tenderness or lesions.    Left Labia: No tenderness, lesions  or rash.    Vaginal bleeding present.     No vaginal discharge, erythema or tenderness.      Right Adnexa: not tender and no mass present.    Left Adnexa: not tender and no mass present.    No cervical friability or polyp.     Uterus is not enlarged or tender.  Breasts:    Right: No mass, nipple discharge, skin change or tenderness.     Left: No mass, nipple discharge, skin change or tenderness.  Neck:     Thyroid: No thyromegaly.  Cardiovascular:     Rate and Rhythm: Normal rate and regular rhythm.     Heart sounds: Normal heart sounds. No murmur heard. Pulmonary:     Effort: Pulmonary effort is normal.     Breath sounds: Normal  breath sounds.  Abdominal:     Palpations: Abdomen is soft.     Tenderness: There is no abdominal tenderness. There is no guarding or rebound.  Musculoskeletal:        General: Normal range of motion.     Cervical back: Normal range of motion.  Lymphadenopathy:     Cervical: No cervical adenopathy.  Neurological:     General: No focal deficit present.     Mental Status: She is alert and oriented to person, place, and time.     Cranial Nerves: No cranial nerve deficit.  Skin:    General: Skin is warm and dry.  Psychiatric:        Mood and Affect: Mood normal.        Behavior: Behavior normal.        Thought Content: Thought content normal.        Judgment: Judgment normal.  Vitals reviewed.    Assessment/Plan: Encounter for annual routine gynecological examination  Cervical cancer screening - Plan: Cytology - PAP  Screening for STD (sexually transmitted disease) - Plan: Cytology - PAP  Encounter for surveillance of injectable contraceptive - Plan: medroxyPROGESTERone  (DEPO-PROVERA ) injection 150 mg; Rx RF, increase ca/Vit D.   Breakthrough bleeding on depo provera --for 2 wks, sx improved. Reassurance. If sx persist, will try POP given hx of protein C def and don't want to add estradiol.   Meds ordered this encounter  Medications    medroxyPROGESTERone  (DEPO-PROVERA ) injection 150 mg             GYN counsel adequate intake of calcium and vitamin D, diet and exercise     F/U  Return in about 1 year (around 01/24/2025) for annual.  Shelvy Heckert B. Soloman Mckeithan, PA-C 01/25/2024 3:10 PM

## 2024-01-31 LAB — CYTOLOGY - PAP
Adequacy: ABSENT
Chlamydia: NEGATIVE
Comment: NEGATIVE
Comment: NORMAL
Diagnosis: NEGATIVE
Neisseria Gonorrhea: NEGATIVE

## 2024-03-11 ENCOUNTER — Ambulatory Visit: Admitting: Obstetrics and Gynecology

## 2024-03-11 ENCOUNTER — Ambulatory Visit

## 2024-03-11 VITALS — BP 113/76 | HR 67 | Ht 61.0 in | Wt 168.5 lb

## 2024-03-11 DIAGNOSIS — Z3042 Encounter for surveillance of injectable contraceptive: Secondary | ICD-10-CM

## 2024-03-11 MED ORDER — MEDROXYPROGESTERONE ACETATE 150 MG/ML IM SUSP
150.0000 mg | Freq: Once | INTRAMUSCULAR | Status: AC
Start: 2024-03-11 — End: 2024-03-11
  Administered 2024-03-11: 150 mg via INTRAMUSCULAR

## 2024-03-11 NOTE — Progress Notes (Signed)
    NURSE VISIT NOTE  Subjective:    Patient ID: Brenda Watson, female    DOB: 13-Feb-2001, 23 y.o.   MRN: [id number]  HPI  Patient is a 23 y.o. G0P0000 female who presents for depo provera  injection.   Objective:    BP 113/76   Pulse 67   Ht 5' 1 (1.549 m)   Wt 168 lb 8 oz (76.4 kg)   BMI 31.84 kg/m   Last Annual: 01/25/24. Last pap: NA Due to age. Last Depo-Provera : 12/18/23. Side Effects if any: none. Serum HCG indicated? No . Depo-Provera  150 mg IM given by: Mathis Getting, CMA. Site: Right Deltoid  Lab Review  No results found for any visits on 03/11/24.  Assessment:   1. Encounter for management and injection of depo-Provera       Plan:   Next appointment due between 05/27/24 and 06/10/24.    Mathis LITTIE Getting, CMA

## 2024-06-03 ENCOUNTER — Ambulatory Visit

## 2024-06-03 NOTE — Patient Instructions (Incomplete)

## 2024-06-03 NOTE — Progress Notes (Deleted)
    NURSE VISIT NOTE  Subjective:    Patient ID: Brenda Watson, female    DOB: 12-17-2000, 23 y.o.   MRN: [id number]  HPI  Patient is a 23 y.o. G0P0000 female who presents for depo provera  injection.   Objective:    There were no vitals taken for this visit.  Last Annual: 01/25/24. Last pap: 01/25/24. Last Depo-Provera : 03/11/24. Side Effects if any: none. Serum HCG indicated? No . Depo-Provera  150 mg IM given by: Rollo Maxin, CMA. Site: Left Deltoid  Lab Review  No results found for any visits on 06/03/24.  Assessment:   No diagnosis found.   Plan:   Next appointment due between 12/15 and 09/02/24.    Rollo JINNY Maxin, CMA

## 2024-09-16 ENCOUNTER — Ambulatory Visit: Payer: Self-pay | Admitting: Emergency Medicine

## 2024-09-16 ENCOUNTER — Ambulatory Visit

## 2024-09-16 ENCOUNTER — Ambulatory Visit
Admission: EM | Admit: 2024-09-16 | Discharge: 2024-09-16 | Disposition: A | Discharge status: Home / Self Care | Attending: Emergency Medicine | Admitting: Emergency Medicine

## 2024-09-16 DIAGNOSIS — D6859 Other primary thrombophilia: Secondary | ICD-10-CM | POA: Diagnosis not present

## 2024-09-16 DIAGNOSIS — M79662 Pain in left lower leg: Secondary | ICD-10-CM | POA: Insufficient documentation

## 2024-09-16 LAB — BASIC METABOLIC PANEL WITH GFR
Anion gap: 10 (ref 5–15)
BUN: 10 mg/dL (ref 6–20)
CO2: 24 mmol/L (ref 22–32)
Calcium: 9.2 mg/dL (ref 8.9–10.3)
Chloride: 105 mmol/L (ref 98–111)
Creatinine, Ser: 0.7 mg/dL (ref 0.44–1.00)
GFR, Estimated: >60 mL/min
Glucose, Bld: 87 mg/dL (ref 70–99)
Potassium: 4.6 mmol/L (ref 3.5–5.1)
Sodium: 138 mmol/L (ref 135–145)

## 2024-09-16 LAB — CBC WITH DIFFERENTIAL/PLATELET
Abs Immature Granulocytes: 0.01 K/uL (ref 0.00–0.07)
Basophils Absolute: 0 K/uL (ref 0.0–0.1)
Basophils Relative: 0 %
Eosinophils Absolute: 0.1 K/uL (ref 0.0–0.5)
Eosinophils Relative: 1 %
HCT: 37.9 % (ref 36.0–46.0)
Hemoglobin: 12.4 g/dL (ref 12.0–15.0)
Immature Granulocytes: 0 %
Lymphocytes Relative: 35 %
Lymphs Abs: 1.8 K/uL (ref 0.7–4.0)
MCH: 32.7 pg (ref 26.0–34.0)
MCHC: 32.7 g/dL (ref 30.0–36.0)
MCV: 100 fL (ref 80.0–100.0)
Monocytes Absolute: 0.3 K/uL (ref 0.1–1.0)
Monocytes Relative: 7 %
Neutro Abs: 2.9 K/uL (ref 1.7–7.7)
Neutrophils Relative %: 57 %
Platelets: 218 K/uL (ref 150–400)
RBC: 3.79 MIL/uL — ABNORMAL LOW (ref 3.87–5.11)
RDW: 12.3 % (ref 11.5–15.5)
WBC: 5.1 K/uL (ref 4.0–10.5)
nRBC: 0 % (ref 0.0–0.2)

## 2024-09-16 MED ORDER — TIZANIDINE HCL 4 MG PO TABS: 4.0000 mg | ORAL_TABLET | Freq: Three times a day (TID) | ORAL | 0 refills | Status: AC | PRN

## 2024-09-16 MED ORDER — IBUPROFEN 600 MG PO TABS: 600.0000 mg | ORAL_TABLET | Freq: Four times a day (QID) | ORAL | 0 refills | Status: DC | PRN

## 2024-09-16 NOTE — ED Triage Notes (Signed)
 PT present left leg pain, symptom started on Friday. Pt state has a history of Blood clots.

## 2024-09-16 NOTE — ED Provider Notes (Signed)
 HPI  SUBJECTIVE:  Brenda Watson is a 24 y.o. female who presents with 4 days of intermittent posterior left calf pain that she describes as intermittent, sharp, crampy.  She is unsure if there is any swelling.  No change in her physical activity, erythema, fevers, chest pain, hemoptysis, wheezing, pleuritic pain.  She reports occasional chest tightness which she has had before.  No shortness of breath.  She has tried aspirin, over-the-counter muscle relaxants and compression stockings without improvement in her symptoms.  Symptoms are worse with weightbearing, and for sitting for too long.  Past medical history negative for cancer, exogenous estrogen, surgery in the past 4 weeks, DVT/PE, asthma.  She has a history of protein C deficiency and is a smoker.  Family history of protein C deficiency, DVT.  LMP: Now.  Denies the possibility of being pregnant.  PCP: Davene    Past Medical History:  Diagnosis Date   Menorrhagia    Protein C deficiency    mother states pt has deficiency. MD telephone call [facility] 12/06/17 says she does not but decreased levels on 2/19 labs   Vaccine for human papilloma virus (HPV) types 6, 11, 16, and 18 administered     Past Surgical History:  Procedure Laterality Date   TONSILLECTOMY Bilateral 09/04/2018   Procedure: TONSILLECTOMY;  Surgeon: Blair Mt, MD;  Location: [facility];  Service: ENT;  Laterality: Bilateral;    Family History  Problem Relation Age of Onset   Protein C deficiency Mother    Migraines Mother    Cancer Sister 51       leoblastoma   Ovarian cancer Maternal Aunt 42       vs uterine   Hypertension Maternal Grandmother    Asthma Paternal Grandmother     Social History[1]  Current Medications[2]  Allergies[3]   ROS  As noted in HPI.   Physical Exam  BP 128/84 (BP Location: Left Arm)   Pulse 60   Temp 98.4 F (36.9 C) (Oral)   Resp 16   Wt 74.4 kg   LMP 09/16/2024 Comment: Pt is on her cycle.  SpO2 100%   BMI  30.99 kg/m   Constitutional: Well developed, well nourished, no acute distress Eyes:  EOMI, conjunctiva normal bilaterally HENT: Normocephalic, atraumatic,mucus membranes moist Respiratory: Normal inspiratory effort Cardiovascular: Normal rate GI: nondistended skin: No rash, skin intact Musculoskeletal: Right calf 39 cm Left calf 40 cm Posterior calf tenderness, no palpable cord.  Popliteal tenderness.  Positive Homans' sign.  No tenderness medial thigh.  Trace edema.  DP 2+. Neurologic: Alert & oriented x 3, no focal neuro deficits Psychiatric: Speech and behavior appropriate   ED Course   Medications - No data to display  Orders Placed This Encounter  Procedures   US  Venous Img Lower Unilateral Left    Standing Status:   Standing    Number of Occurrences:   1    Reason for Exam (SYMPTOM  OR DIAGNOSIS REQUIRED):   protein C deficiency, I posterior left calf pain, swelling.  Rule out DVT   CBC with Differential    Standing Status:   Standing    Number of Occurrences:   1   Basic metabolic panel    Standing Status:   Standing    Number of Occurrences:   1    No results found for this or any previous visit (from the past 24 hours). US  Venous Img Lower Unilateral Left Result Date: 09/16/2024 CLINICAL DATA:  LEFT calf pain  for 4 days EXAM: LEFT LOWER EXTREMITY VENOUS DOPPLER ULTRASOUND TECHNIQUE: Gray-scale sonography with compression, as well as color and duplex ultrasound, were performed to evaluate the deep venous system(s) from the level of the common femoral vein through the popliteal and proximal calf veins. COMPARISON:  None available FINDINGS: VENOUS Normal compressibility of the common femoral, superficial femoral, and popliteal veins, as well as the visualized calf veins. Visualized portions of profunda femoral vein and great saphenous vein unremarkable. No filling defects to suggest DVT on grayscale or color Doppler imaging. Doppler waveforms show normal direction of  venous flow, normal respiratory plasticity and response to augmentation. Limited views of the contralateral common femoral vein are unremarkable. OTHER None. Limitations: none IMPRESSION: No DVT of the left lower extremity. Electronically Signed   By: Aliene Lloyd M.D.   On: 09/16/2024 16:57    ED Clinical Impression  1. Pain of left calf   2. Protein C deficiency      ED Assessment/Plan     Concern for DVT.  Ultrasound is available today.    Wells score for DVT 2 points.  She is at moderate risk for DVT.  We have ultrasound available here today, so we will go ahead and proceed with this rather than obtain D-dimer testing and subsequent imaging if necessary. Will check ultrasound, CBC, BMP.  Will start on Xarelto  if DVT is positive.  Patient PERC negative.  Vitals normal, patient reports occasional chest tightness, she denies significant shortness of breath.  No hemoptysis, chest pain.  She has had chest tightness like this before.  Low suspicion for PE.  Reviewed radiology report.  No DVT.  See radiology report for full details.  Ultrasound negative for DVT.  Home with Tylenol /ibuprofen , Zanaflex .  Strict ER return precautions given.  Discussed labs, imaging, MDM, treatment plan, and plan for follow-up with patient. Discussed sn/sx that should prompt return to the ED. patient agrees with plan.   Spent 40 minutes in the care of this patient.  Meds ordered this encounter  Medications   ibuprofen  (ADVIL ) 600 MG tablet    Sig: Take 1 tablet (600 mg total) by mouth every 6 (six) hours as needed.    Dispense:  30 tablet    Refill:  0   tiZANidine  (ZANAFLEX ) 4 MG tablet    Sig: Take 1 tablet (4 mg total) by mouth every 8 (eight) hours as needed for muscle spasms.    Dispense:  30 tablet    Refill:  0      *This clinic note was created using Scientist, clinical (histocompatibility and immunogenetics). Therefore, there may be occasional mistakes despite careful proofreading.  ?     [1]  Social  History Tobacco Use   Smoking status: Never   Smokeless tobacco: Never  Vaping Use   Vaping status: Former  Substance Use Topics   Alcohol use: No   Drug use: Yes    Types: Marijuana  [2]  Current Facility-Administered Medications:    medroxyPROGESTERone  (DEPO-PROVERA ) injection 150 mg, 150 mg, Intramuscular, Q90 days,   Current Outpatient Medications:    ibuprofen  (ADVIL ) 600 MG tablet, Take 1 tablet (600 mg total) by mouth every 6 (six) hours as needed., Disp: 30 tablet, Rfl: 0   tiZANidine  (ZANAFLEX ) 4 MG tablet, Take 1 tablet (4 mg total) by mouth every 8 (eight) hours as needed for muscle spasms., Disp: 30 tablet, Rfl: 0   FLUoxetine  (PROZAC ) 20 MG tablet, Take 1 tablet (20 mg total) by mouth daily., Disp: 90 tablet,  Rfl: 3   SUMAtriptan  (IMITREX ) 100 MG tablet, May repeat in 2 hours if headache persists or recurs. Do not take more than 2 doses in 24 hour period., Disp: 30 tablet, Rfl: 0 [3]  Allergies Allergen Reactions   Pineapple     Mouth and throat get swollen   Pineapple Extract Other (See Comments)     Van Knee, MD 09/20/24 [id number]

## 2024-09-16 NOTE — Discharge Instructions (Addendum)
 We will contact you if there is a blood clot in your leg, and I will start you on Xarelto .  Otherwise, take 1000 mg of Tylenol  combined with 600 mg of ibuprofen  3-4 times a day, Zanaflex  muscle relaxant as needed.  Warm compresses may help.  Go to the ER if you start having chest pain, severe or worsening shortness of breath, coughing up blood, fast heart rate, worsening of your symptoms, or for any other concerns

## 2024-09-18 ENCOUNTER — Emergency Department (HOSPITAL_COMMUNITY)

## 2024-09-18 ENCOUNTER — Encounter: Payer: Self-pay | Admitting: Internal Medicine

## 2024-09-18 ENCOUNTER — Other Ambulatory Visit: Payer: Self-pay

## 2024-09-18 ENCOUNTER — Encounter (HOSPITAL_COMMUNITY): Payer: Self-pay

## 2024-09-18 ENCOUNTER — Emergency Department (HOSPITAL_COMMUNITY)
Admission: EM | Admit: 2024-09-18 | Discharge: 2024-09-19 | Disposition: A | Discharge status: Home / Self Care | Source: Home / Self Care | Attending: Emergency Medicine | Admitting: Emergency Medicine

## 2024-09-18 DIAGNOSIS — I2699 Other pulmonary embolism without acute cor pulmonale: Secondary | ICD-10-CM | POA: Diagnosis not present

## 2024-09-18 DIAGNOSIS — R0602 Shortness of breath: Secondary | ICD-10-CM | POA: Diagnosis present

## 2024-09-18 DIAGNOSIS — Z7901 Long term (current) use of anticoagulants: Secondary | ICD-10-CM | POA: Insufficient documentation

## 2024-09-18 DIAGNOSIS — D6859 Other primary thrombophilia: Secondary | ICD-10-CM | POA: Diagnosis not present

## 2024-09-18 LAB — CBC WITH DIFFERENTIAL/PLATELET
Abs Immature Granulocytes: 0.01 K/uL (ref 0.00–0.07)
Basophils Absolute: 0 K/uL (ref 0.0–0.1)
Basophils Relative: 0 %
Eosinophils Absolute: 0.1 K/uL (ref 0.0–0.5)
Eosinophils Relative: 2 %
HCT: 37.1 % (ref 36.0–46.0)
Hemoglobin: 12.2 g/dL (ref 12.0–15.0)
Immature Granulocytes: 0 %
Lymphocytes Relative: 30 %
Lymphs Abs: 1.8 K/uL (ref 0.7–4.0)
MCH: 33.5 pg (ref 26.0–34.0)
MCHC: 32.9 g/dL (ref 30.0–36.0)
MCV: 101.9 fL — ABNORMAL HIGH (ref 80.0–100.0)
Monocytes Absolute: 0.6 K/uL (ref 0.1–1.0)
Monocytes Relative: 10 %
Neutro Abs: 3.4 K/uL (ref 1.7–7.7)
Neutrophils Relative %: 58 %
Platelets: 206 K/uL (ref 150–400)
RBC: 3.64 MIL/uL — ABNORMAL LOW (ref 3.87–5.11)
RDW: 12.3 % (ref 11.5–15.5)
WBC: 5.9 K/uL (ref 4.0–10.5)
nRBC: 0 % (ref 0.0–0.2)

## 2024-09-18 LAB — URINALYSIS, ROUTINE W REFLEX MICROSCOPIC
Bilirubin Urine: NEGATIVE
Glucose, UA: NEGATIVE mg/dL
Hgb urine dipstick: NEGATIVE
Ketones, ur: NEGATIVE mg/dL
Leukocytes,Ua: NEGATIVE
Nitrite: NEGATIVE
Protein, ur: 30 mg/dL — AB
Specific Gravity, Urine: 1.029 (ref 1.005–1.030)
pH: 7 (ref 5.0–8.0)

## 2024-09-18 LAB — COMPREHENSIVE METABOLIC PANEL WITH GFR
ALT: 7 U/L (ref 0–44)
AST: 15 U/L (ref 15–41)
Albumin: 4.3 g/dL (ref 3.5–5.0)
Alkaline Phosphatase: 72 U/L (ref 38–126)
Anion gap: 11 (ref 5–15)
BUN: 8 mg/dL (ref 6–20)
CO2: 20 mmol/L — ABNORMAL LOW (ref 22–32)
Calcium: 8.7 mg/dL — ABNORMAL LOW (ref 8.9–10.3)
Chloride: 109 mmol/L (ref 98–111)
Creatinine, Ser: 0.72 mg/dL (ref 0.44–1.00)
GFR, Estimated: >60 mL/min
Glucose, Bld: 84 mg/dL (ref 70–99)
Potassium: 4 mmol/L (ref 3.5–5.1)
Sodium: 140 mmol/L (ref 135–145)
Total Bilirubin: 0.8 mg/dL (ref 0.0–1.2)
Total Protein: 7.1 g/dL (ref 6.5–8.1)

## 2024-09-18 LAB — LIPASE, BLOOD: Lipase: 19 U/L (ref 11–51)

## 2024-09-18 LAB — D-DIMER, QUANTITATIVE: D-Dimer, Quant: 1.06 ug{FEU}/mL — ABNORMAL HIGH (ref 0.00–0.50)

## 2024-09-18 LAB — TROPONIN T, HIGH SENSITIVITY: Troponin T High Sensitivity: <15 ng/L (ref 0–19)

## 2024-09-18 LAB — PREGNANCY, URINE: Preg Test, Ur: NEGATIVE

## 2024-09-18 MED ORDER — IOHEXOL 350 MG/ML SOLN
75.0000 mL | Freq: Once | INTRAVENOUS | Status: AC | PRN
  Administered 2024-09-18: 75 mL via INTRAVENOUS

## 2024-09-18 MED ORDER — KETOROLAC TROMETHAMINE 15 MG/ML IJ SOLN
15.0000 mg | Freq: Once | INTRAMUSCULAR | Status: AC
  Administered 2024-09-18: 15 mg via INTRAVENOUS
  Filled 2024-09-18: qty 1

## 2024-09-18 MED ORDER — HEPARIN BOLUS VIA INFUSION
4000.0000 [IU] | Freq: Once | INTRAVENOUS | Status: AC
  Administered 2024-09-18: 4000 [IU] via INTRAVENOUS
  Filled 2024-09-18: qty 4000

## 2024-09-18 MED ORDER — HYDROCODONE-ACETAMINOPHEN 5-325 MG PO TABS
1.0000 | ORAL_TABLET | Freq: Once | ORAL | Status: AC
  Administered 2024-09-18: 1 via ORAL
  Filled 2024-09-18: qty 1

## 2024-09-18 MED ORDER — HEPARIN (PORCINE) 25000 UT/250ML-% IV SOLN
1100.0000 [IU]/h | INTRAVENOUS | Status: DC
  Administered 2024-09-18: 1100 [IU]/h via INTRAVENOUS
  Filled 2024-09-18: qty 250

## 2024-09-18 NOTE — ED Provider Triage Note (Signed)
 Emergency Medicine Provider Triage Evaluation Note  Brenda Watson , a 24 y.o. female  was evaluated in triage.  Pt complains of left-sided chest wall pain that began while eating dinner at work last evening.  States pain is constant and getting worse.  Worse with taking deep breaths.  She has not been on birth control since July.  No fall or injury.  No recent long distance travel or history of blood clots.  Review of Systems  Positive: Chest wall pain, shortness of breath Negative: Fevers, hemoptysis  Physical Exam  BP 126/76 (BP Location: Right Arm)   Pulse 82   Temp 98.4 F (36.9 C)   Resp 16   LMP 09/16/2024 Comment: Pt is on her cycle.  SpO2 97%  Gen:   Awake, no distress   Resp:  Normal effort  MSK:   Moves extremities without difficulty  Other:  Very mild left wall tenderness.  No tachypnea, crepitus, flail chest  Medical Decision Making  Medically screening exam initiated at 6:03 PM.  Appropriate orders placed.  Brenda Watson was informed that the remainder of the evaluation will be completed by another provider, this initial triage assessment does not replace that evaluation, and the importance of remaining in the ED until their evaluation is complete.     Brenda Watson, [place] 09/18/24 1805

## 2024-09-18 NOTE — ED Triage Notes (Signed)
 Pt to er, pt states that she was on lunch last night at work and started having some chest pain, states that it hurts to take a deep breath, hurts to move or turn, states that it also hurts to lay on it. Pt talking in full sentences.

## 2024-09-18 NOTE — ED Notes (Signed)
 Pt states she is short of breath due to pain that she has under left ribs that radiates up to the left shoulder/neck.   Pt denies any recent injury/falls.

## 2024-09-18 NOTE — ED Notes (Signed)
 Pt stating her pain is getting worse. Sort RN notified. RN finding out if Pt can have any pain meds.

## 2024-09-18 NOTE — Progress Notes (Signed)
 PHARMACY - ANTICOAGULATION CONSULT NOTE  Pharmacy Consult for IV heparin  Indication: pulmonary embolus  Allergies[1]  Patient Measurements: Height: 5' 1 (154.9 cm) Weight: 73.9 kg (163 lb) IBW/kg (Calculated) : 47.8 HEPARIN  DW (KG): 64  Vital Signs: Temp: 99.9 F (37.7 C) (01/14 2200) Temp Source: Oral (01/14 2200) BP: 125/70 (01/14 2202) Pulse Rate: 68 (01/14 2202)  Labs: Recent Labs    09/16/24 1540 09/18/24 1803  HGB 12.4 12.2  HCT 37.9 37.1  PLT 218 206  CREATININE 0.70 0.72    Estimated Creatinine Clearance: 102.2 mL/min (by C-G formula based on SCr of 0.72 mg/dL).   Medical History: Past Medical History:  Diagnosis Date   Menorrhagia    Protein C deficiency    mother states pt has deficiency. MD telephone call [facility] 12/06/17 says she does not but decreased levels on 2/19 labs   Vaccine for human papilloma virus (HPV) types 6, 11, 16, and 18 administered     Medications:  (Not in a hospital admission)   Assessment: 24 yo F presenting with CP found to have PE with no evidence of R heart strain. Not on AC PTA. Pharmacy consulted to dose heparin .   Goal of Therapy:  Heparin  level 0.3-0.7 units/ml Monitor platelets by anticoagulation protocol: Yes   Plan:  Give 4000 units bolus x 1 Start heparin  infusion at 1100 units/hr Check anti-Xa level in 6 hours and daily while on heparin  Continue to monitor H&H and platelets F/u transition to DOAC  Elma Fail, PharmD PGY1 Clinical Pharmacist [facility]  09/18/2024 10:38 PM       [1]  Allergies Allergen Reactions   Pineapple     Mouth and throat get swollen   Pineapple Extract Other (See Comments)

## 2024-09-18 NOTE — ED Provider Notes (Signed)
 " [name] EMERGENCY DEPARTMENT AT [facility] Provider Note   CSN: [id number] Arrival date & time: 09/18/24  1744     Patient presents with: Shortness of Breath   Brenda Watson is a 24 y.o. female.  Patient with known protein C deficiency presents to the emergency department accompanied by her mother complaining of chest pain.  She states that last night at work she began to have chest pain with deep inspiration.  She complains of pain with movement and turning.  She also complains of pain with laying on her side.  She denies shortness of breath, abdominal pain, nausea, vomiting.  She reports having an ultrasound earlier this week to rule out DVT of the left lower extremity.  This was negative for DVT but patient continues to complain of some tenderness to the left lower extremity, specifically the calf region    Shortness of Breath      Prior to Admission medications  Medication Sig Start Date End Date Taking? Authorizing Provider  HYDROcodone -acetaminophen  (NORCO/VICODIN) 5-325 MG tablet Take 1 tablet by mouth every 6 (six) hours as needed. 09/19/24  Yes Logan Ubaldo NOVAK, PA-C  ibuprofen  (ADVIL ) 600 MG tablet Take 1 tablet (600 mg total) by mouth every 6 (six) hours as needed. 09/16/24  Yes Van Knee, MD  RIVAROXABAN  (XARELTO ) VTE STARTER PACK (15 & 20 MG) Follow package directions: Take one 15mg  tablet by mouth twice a day. On day 22, switch to one 20mg  tablet once a day. Take with food. 09/19/24  Yes Logan Ubaldo NOVAK, PA-C  tiZANidine  (ZANAFLEX ) 4 MG tablet Take 1 tablet (4 mg total) by mouth every 8 (eight) hours as needed for muscle spasms. 09/16/24  Yes Van Knee, MD    Allergies: Pineapple and Pineapple extract    Review of Systems  Respiratory:  Positive for shortness of breath.     Updated Vital Signs BP 117/89 (BP Location: Right Arm)   Pulse 70   Temp 98.9 F (37.2 C) (Oral)   Resp 17   Ht 5' 1 (1.549 m)   Wt 73.9 kg   LMP 09/16/2024  Comment: Pt is on her cycle.  SpO2 100%   BMI 30.80 kg/m   Physical Exam Vitals and nursing note reviewed.  Constitutional:      General: She is not in acute distress.    Appearance: She is well-developed.  HENT:     Head: Normocephalic and atraumatic.  Eyes:     Conjunctiva/sclera: Conjunctivae normal.  Cardiovascular:     Rate and Rhythm: Regular rhythm. Tachycardia present.     Heart sounds: No murmur heard. Pulmonary:     Effort: Pulmonary effort is normal. No respiratory distress.     Breath sounds: Normal breath sounds.  Abdominal:     Palpations: Abdomen is soft.     Tenderness: There is no abdominal tenderness.  Musculoskeletal:        General: No swelling.     Cervical back: Neck supple.     Left lower leg: Tenderness present.     Comments: Minimal calf tenderness with no appreciable swelling  Skin:    General: Skin is warm and dry.     Capillary Refill: Capillary refill takes less than 2 seconds.  Neurological:     Mental Status: She is alert.  Psychiatric:        Mood and Affect: Mood normal.     (all labs ordered are listed, but only abnormal results are displayed) Labs Reviewed  COMPREHENSIVE  METABOLIC PANEL WITH GFR - Abnormal; Notable for the following components:      Result Value   CO2 20 (*)    Calcium 8.7 (*)    All other components within normal limits  CBC WITH DIFFERENTIAL/PLATELET - Abnormal; Notable for the following components:   RBC 3.64 (*)    MCV 101.9 (*)    All other components within normal limits  D-DIMER, QUANTITATIVE - Abnormal; Notable for the following components:   D-Dimer, Quant 1.06 (*)    All other components within normal limits  URINALYSIS, ROUTINE W REFLEX MICROSCOPIC - Abnormal; Notable for the following components:   APPearance HAZY (*)    Protein, ur 30 (*)    Bacteria, UA RARE (*)    All other components within normal limits  LIPASE, BLOOD  PREGNANCY, URINE  TROPONIN T, HIGH SENSITIVITY  TROPONIN T, HIGH  SENSITIVITY    EKG: EKG Interpretation Date/Time:  Wednesday September 18 2024 17:58:14 EST Ventricular Rate:  67 PR Interval:  136 QRS Duration:  84 QT Interval:  364 QTC Calculation: 384 R Axis:   76  Text Interpretation: Normal sinus rhythm Normal ECG When compared with ECG of 11-Nov-2019 13:16, No significant change was found Confirmed by Raford Lenis ([id number]) on 09/19/2024 12:12:01 AM  Radiology: CT Angio Chest PE W and/or Wo Contrast Result Date: 09/18/2024 EXAM: CTA CHEST 09/18/2024 09:19:09 PM TECHNIQUE: CTA of the chest was performed after the administration of 75 mL of intravenous contrast (iohexol  (OMNIPAQUE ) 350 MG/ML injection). Multiplanar reformatted images are provided for review. MIP images are provided for review. Automated exposure control, iterative reconstruction, and/or weight based adjustment of the mA/kV was utilized to reduce the radiation dose to as low as reasonably achievable. COMPARISON: None available. CLINICAL HISTORY: Pulmonary embolism (PE), low to intermediate probability, positive D-dimer. FINDINGS: PULMONARY ARTERIES: Pulmonary arteries are adequately opacified for evaluation. A single branching intraluminal filling defect is seen within the segmental pulmonary arteries of the right middle lobe in keeping with acute pulmonary embolus. The embolic burden is tiny. The central pulmonary arteries are of normal caliber. MEDIASTINUM: Global cardiac size within normal limits. No significant coronary artery calcification. No CT evidence of right heart strain. Residual thymic tissue within the anterior mediastinum. There is no acute abnormality of the thoracic aorta. LYMPH NODES: No mediastinal, hilar or axillary lymphadenopathy. LUNGS AND PLEURA: The lungs are without acute process. No focal consolidation or pulmonary edema. No evidence of pleural effusion or pneumothorax. UPPER ABDOMEN: Limited images of the upper abdomen are unremarkable. SOFT TISSUES AND BONES: No acute  bone or soft tissue abnormality. IMPRESSION: 1. Acute pulmonary embolus in the segmental pulmonary arteries of the right middle lobe. Tiny embolic burden. No CT evidence of right heart strain. Electronically signed by: Dorethia Molt MD 09/18/2024 09:25 PM EST RP Workstation: [id number]     Procedures   Medications Ordered in the ED  Rivaroxaban  (XARELTO ) tablet 15 mg (15 mg Oral Given 09/19/24 0259)    Followed by  rivaroxaban  (XARELTO ) tablet 20 mg (has no administration in time range)  HYDROcodone -acetaminophen  (NORCO/VICODIN) 5-325 MG per tablet 1 tablet (has no administration in time range)  ketorolac  (TORADOL ) 15 MG/ML injection 15 mg (15 mg Intravenous Given 09/18/24 2041)  iohexol  (OMNIPAQUE ) 350 MG/ML injection 75 mL (75 mLs Intravenous Contrast Given 09/18/24 2119)  heparin  bolus via infusion 4,000 Units (4,000 Units Intravenous Bolus from Bag 09/18/24 2256)  HYDROcodone -acetaminophen  (NORCO/VICODIN) 5-325 MG per tablet 1 tablet (1 tablet Oral Given 09/18/24 2341)  rivaroxaban  (  XARELTO ) Education Kit for DVT/PE patients ( Does not apply Given 09/19/24 0216)                                    Medical Decision Making Amount and/or Complexity of Data Reviewed Labs: ordered.  Risk Prescription drug management. Decision regarding hospitalization.   This patient presents to the ED for concern of chest pain, this involves an extensive number of treatment options, and is a complaint that carries with it a high risk of complications and morbidity.  The differential diagnosis includes pulmonary embolism, musculoskeletal pain, pneumonia, ACS, anxiety, others   Co morbidities / Chronic conditions that complicate the patient evaluation  Protein C deficiency   Additional history obtained:  Additional history obtained from EMR External records from outside source obtained and reviewed including heme-onc notes from 2019 showing no concern at that time for protein C deficiency   Lab  Tests:  I Ordered, and personally interpreted labs.  The pertinent results include: D-dimer 1.06, troponin less than 15   Imaging Studies ordered:  I ordered imaging studies including CT angio chest PE study I independently visualized and interpreted imaging which showed  1. Acute pulmonary embolus in the segmental pulmonary arteries of the right  middle lobe. Tiny embolic burden. No CT evidence of right heart strain.   I agree with the radiologist interpretation   Cardiac Monitoring: / EKG:  The patient was maintained on a cardiac monitor.  I personally viewed and interpreted the cardiac monitored which showed an underlying rhythm of: Normal sinus rhythm   Problem List / ED Course / Critical interventions / Medication management   I ordered medication including Toradol , Norco, heparin  Reevaluation of the patient after these medicines showed that the patient improved  Consultations Obtained:  I requested consultation with the medicine service, Dr.Claiborne and discussed lab and imaging findings as well as pertinent plan - they recommend: initially considered admission but based on small size of PE and reassuring vitals, thought patient could be treated as an outpatient with Xarelto  (mother currently takes Xarelto )   Test / Admission - Considered:  Patient with acute PE in the right middle lobe segmental pulmonary arteries with no sign of right heart strain.  Heparin  initiated.  Family reports protein C deficiency although I can find no records of the same.  Initially planned on admission for management but after discussion with hospitalist and patient has been decided the patient would be amenable to outpatient therapy with Xarelto .  Prescription provided.  Initial dose provided here in the emergency department.  Patient also provided short course of Norco for pain control.  Patient has an appointment with primary care on Friday and will follow-up with them for further  recommendations as needed.  Patient stable for discharge home.      Final diagnoses:  Acute pulmonary embolism without acute cor pulmonale, unspecified pulmonary embolism type [facility])    ED Discharge Orders          Ordered    RIVAROXABAN  (XARELTO ) VTE STARTER PACK (15 & 20 MG)        09/19/24 0247    HYDROcodone -acetaminophen  (NORCO/VICODIN) 5-325 MG tablet  Every 6 hours PRN        09/19/24 0247               Logan Ubaldo NOVAK, PA-C 09/19/24 0308    Ruthe Cornet, DO 09/19/24 1506  "

## 2024-09-19 ENCOUNTER — Other Ambulatory Visit (HOSPITAL_COMMUNITY): Payer: Self-pay

## 2024-09-19 ENCOUNTER — Other Ambulatory Visit: Payer: Self-pay

## 2024-09-19 DIAGNOSIS — D6859 Other primary thrombophilia: Secondary | ICD-10-CM

## 2024-09-19 DIAGNOSIS — I2699 Other pulmonary embolism without acute cor pulmonale: Secondary | ICD-10-CM | POA: Diagnosis not present

## 2024-09-19 LAB — TROPONIN T, HIGH SENSITIVITY: Troponin T High Sensitivity: <15 ng/L (ref 0–19)

## 2024-09-19 MED ORDER — RIVAROXABAN 10 MG PO TABS: 20.0000 mg | ORAL_TABLET | Freq: Every day | ORAL | Status: DC

## 2024-09-19 MED ORDER — RIVAROXABAN 15 MG PO TABS
15.0000 mg | ORAL_TABLET | Freq: Two times a day (BID) | ORAL | Status: DC
  Administered 2024-09-19: 15 mg via ORAL
  Filled 2024-09-19: qty 1

## 2024-09-19 MED ORDER — RIVAROXABAN (XARELTO) EDUCATION KIT FOR DVT/PE PATIENTS
PACK | Freq: Once | Status: AC
  Filled 2024-09-19: qty 1

## 2024-09-19 MED ORDER — RIVAROXABAN (XARELTO) VTE STARTER PACK (15 & 20 MG)
ORAL_TABLET | ORAL | 0 refills | Status: AC
  Filled 2024-09-19: qty 51, 30d supply, fill #0

## 2024-09-19 MED ORDER — HYDROCODONE-ACETAMINOPHEN 5-325 MG PO TABS
1.0000 | ORAL_TABLET | Freq: Once | ORAL | Status: AC
  Administered 2024-09-19: 1 via ORAL
  Filled 2024-09-19: qty 1

## 2024-09-19 MED ORDER — HYDROCODONE-ACETAMINOPHEN 5-325 MG PO TABS: 1.0000 | ORAL_TABLET | Freq: Four times a day (QID) | ORAL | 0 refills | Status: DC | PRN

## 2024-09-19 NOTE — Discharge Instructions (Addendum)
 Information on my medicine - XARELTO  (rivaroxaban )  This medication education was reviewed with me or my healthcare representative as part of my discharge preparation.  The pharmacist that spoke with me during my hospital stay was:  Mattie Marvetta Bucks, [facility]  WHY WAS XARELTO  PRESCRIBED FOR YOU? Xarelto  was prescribed to treat blood clots that may have been found in the veins of your legs (deep vein thrombosis) or in your lungs (pulmonary embolism) and to reduce the risk of them occurring again.  What do you need to know about Xarelto ? The starting dose is one 15 mg tablet taken TWICE daily with food for the FIRST 21 DAYS then on (enter date)  10/11/2024  the dose is changed to one 20 mg tablet taken ONCE A DAY with your evening meal.  DO NOT stop taking Xarelto  without talking to the health care provider who prescribed the medication.  Refill your prescription for 20 mg tablets before you run out.  After discharge, you should have regular check-up appointments with your healthcare provider that is prescribing your Xarelto .  In the future your dose may need to be changed if your kidney function changes by a significant amount.  What do you do if you miss a dose? If you are taking Xarelto  TWICE DAILY and you miss a dose, take it as soon as you remember. You may take two 15 mg tablets (total 30 mg) at the same time then resume your regularly scheduled 15 mg twice daily the next day.  If you are taking Xarelto  ONCE DAILY and you miss a dose, take it as soon as you remember on the same day then continue your regularly scheduled once daily regimen the next day. Do not take two doses of Xarelto  at the same time.   Important Safety Information Xarelto  is a blood thinner medicine that can cause bleeding. You should call your healthcare provider right away if you experience any of the following: Bleeding from an injury or your nose that does not stop. Unusual colored urine (red or dark brown) or  unusual colored stools (red or black). Unusual bruising for unknown reasons. A serious fall or if you hit your head (even if there is no bleeding).  Some medicines may interact with Xarelto  and might increase your risk of bleeding while on Xarelto . To help avoid this, consult your healthcare provider or pharmacist prior to using any new prescription or non-prescription medications, including herbals, vitamins, non-steroidal anti-inflammatory drugs (NSAIDs) and supplements.  This website has more information on Xarelto : www.xarelto .com.  You were also prescribed a pain relief medication called Norco.  Please use this sparingly.  You may also take Tylenol .  Do not exceed 4000 mg of Tylenol  from all sources in a 24-hour period.  Follow-up at your scheduled appointment Friday with your primary care provider.

## 2024-09-19 NOTE — H&P (Signed)
 " History and Physical    Patient: Brenda Watson FMW:[id number] DOB: Jun 16, 2001 DOA: 09/18/2024 DOS: the patient was seen and examined on 09/19/2024 PCP: Bernardo Fend, DO  Patient coming from: Home  Chief Complaint:  Chief Complaint  Patient presents with   Shortness of Breath   HPI: Brenda Watson is a 24 y.o. female with medical history significant protein C deficiency but no history of blood clots presents with complaints of left chest pain most severe when she takes a deep breath.  It started just over 24 hours ago.  She has never had anything like this before.  She denies any fever or cough.  The pain is under her left breast area.  She tells me she was actually seen in urgent care 4 days ago with complaints of left calf pain.  She says an ultrasound was done on her leg and no blood clot was found. In the emergency department because of her risk factors the patient did have a CTA of the chest.  That revealed a small right middle lobe pulmonary embolism.  The patient was started on IV heparin  and the hospitalist were called for admission. He did not require oxygen.  She does not have significant shortness of breath she just has the pain in her left rib cage area when she takes a deep breath.  Her mother has protein C deficiency and is on Xarelto .   After discussing the the findings with the ED provider and the patient and the patient's mother we will plan to discharge the patient on oral Xarelto .    Review of Systems: As mentioned in the history of present illness. All other systems reviewed and are negative. Past Medical History:  Diagnosis Date   Menorrhagia    Protein C deficiency    mother states pt has deficiency. MD telephone call [facility] 12/06/17 says she does not but decreased levels on 2/19 labs   Vaccine for human papilloma virus (HPV) types 6, 11, 16, and 18 administered    Past Surgical History:  Procedure Laterality Date   TONSILLECTOMY Bilateral 09/04/2018    Procedure: TONSILLECTOMY;  Surgeon: Blair Mt, MD;  Location: [facility];  Service: ENT;  Laterality: Bilateral;   Social History:  reports that she has never smoked. She has never used smokeless tobacco. She reports current drug use. Drug: Marijuana. She reports that she does not drink alcohol.  Allergies[1]  Family History  Problem Relation Age of Onset   Protein C deficiency Mother    Migraines Mother    Cancer Sister 55       leoblastoma   Ovarian cancer Maternal Aunt 42       vs uterine   Hypertension Maternal Grandmother    Asthma Paternal Grandmother     Prior to Admission medications  Medication Sig Start Date End Date Taking? Authorizing Provider  ibuprofen  (ADVIL ) 600 MG tablet Take 1 tablet (600 mg total) by mouth every 6 (six) hours as needed. 09/16/24  Yes Van Knee, MD  tiZANidine  (ZANAFLEX ) 4 MG tablet Take 1 tablet (4 mg total) by mouth every 8 (eight) hours as needed for muscle spasms. 09/16/24  Yes Van Knee, MD    Physical Exam: Vitals:   09/18/24 2215 09/18/24 2230 09/18/24 2330 09/18/24 2345  BP: 128/81 129/84 120/82 133/84  Pulse: 72 82 71 71  Resp:      Temp:      TempSrc:      SpO2: 100% 98% 92% 100%  Weight:  Height:       Physical Exam:  General: No acute distress, well developed, well nourished HEENT: Normocephalic, atraumatic, PERRL Cardiovascular: Normal rate and rhythm. Distal pulses intact. Pulmonary: Normal pulmonary effort, normal breath sounds Gastrointestinal: Nondistended abdomen, soft, mild tenderness but she says it hurts in her chest when I push on her abdomen, normoactive bowel sounds Musculoskeletal:Normal ROM, no lower ext edema Lymphadenopathy: No cervical LAD. Skin: Skin is warm and dry. Neuro: No focal deficits noted, AAOx3. PSYCH: Attentive and cooperative  Data Reviewed:   Results for orders placed or performed during the hospital encounter of 09/18/24 (from the past 24 hours)   Comprehensive metabolic panel     Status: Abnormal   Collection Time: 09/18/24  6:03 PM  Result Value Ref Range   Sodium 140 135 - 145 mmol/L   Potassium 4.0 3.5 - 5.1 mmol/L   Chloride 109 98 - 111 mmol/L   CO2 20 (L) 22 - 32 mmol/L   Glucose, Bld 84 70 - 99 mg/dL   BUN 8 6 - 20 mg/dL   Creatinine, Ser 9.27 0.44 - 1.00 mg/dL   Calcium 8.7 (L) 8.9 - 10.3 mg/dL   Total Protein 7.1 6.5 - 8.1 g/dL   Albumin 4.3 3.5 - 5.0 g/dL   AST 15 15 - 41 U/L   ALT 7 0 - 44 U/L   Alkaline Phosphatase 72 38 - 126 U/L   Total Bilirubin 0.8 0.0 - 1.2 mg/dL   GFR, Estimated >39 >39 mL/min   Anion gap 11 5 - 15  CBC with Differential     Status: Abnormal   Collection Time: 09/18/24  6:03 PM  Result Value Ref Range   WBC 5.9 4.0 - 10.5 K/uL   RBC 3.64 (L) 3.87 - 5.11 MIL/uL   Hemoglobin 12.2 12.0 - 15.0 g/dL   HCT 62.8 63.9 - 53.9 %   MCV 101.9 (H) 80.0 - 100.0 fL   MCH 33.5 26.0 - 34.0 pg   MCHC 32.9 30.0 - 36.0 g/dL   RDW 87.6 88.4 - 84.4 %   Platelets 206 150 - 400 K/uL   nRBC 0.0 0.0 - 0.2 %   Neutrophils Relative % 58 %   Neutro Abs 3.4 1.7 - 7.7 K/uL   Lymphocytes Relative 30 %   Lymphs Abs 1.8 0.7 - 4.0 K/uL   Monocytes Relative 10 %   Monocytes Absolute 0.6 0.1 - 1.0 K/uL   Eosinophils Relative 2 %   Eosinophils Absolute 0.1 0.0 - 0.5 K/uL   Basophils Relative 0 %   Basophils Absolute 0.0 0.0 - 0.1 K/uL   Immature Granulocytes 0 %   Abs Immature Granulocytes 0.01 0.00 - 0.07 K/uL  Lipase, blood     Status: None   Collection Time: 09/18/24  6:03 PM  Result Value Ref Range   Lipase 19 11 - 51 U/L  D-dimer, quantitative     Status: Abnormal   Collection Time: 09/18/24  6:03 PM  Result Value Ref Range   D-Dimer, Quant 1.06 (H) 0.00 - 0.50 ug/mL-FEU  Urinalysis, Routine w reflex microscopic -Urine, Clean Catch     Status: Abnormal   Collection Time: 09/18/24  6:21 PM  Result Value Ref Range   Color, Urine YELLOW YELLOW   APPearance HAZY (A) CLEAR   Specific Gravity, Urine  1.029 1.005 - 1.030   pH 7.0 5.0 - 8.0   Glucose, UA NEGATIVE NEGATIVE mg/dL   Hgb urine dipstick NEGATIVE NEGATIVE   Bilirubin Urine  NEGATIVE NEGATIVE   Ketones, ur NEGATIVE NEGATIVE mg/dL   Protein, ur 30 (A) NEGATIVE mg/dL   Nitrite NEGATIVE NEGATIVE   Leukocytes,Ua NEGATIVE NEGATIVE   RBC / HPF 0-5 0 - 5 RBC/hpf   WBC, UA 0-5 0 - 5 WBC/hpf   Bacteria, UA RARE (A) NONE SEEN   Squamous Epithelial / HPF 0-5 0 - 5 /HPF   Mucus PRESENT   Pregnancy, urine     Status: None   Collection Time: 09/18/24  6:21 PM  Result Value Ref Range   Preg Test, Ur NEGATIVE NEGATIVE  Troponin T, High Sensitivity     Status: None   Collection Time: 09/18/24 10:37 PM  Result Value Ref Range   Troponin T High Sensitivity <15 0 - 19 ng/L     CTA chest IMPRESSION: 1. Acute pulmonary embolus in the segmental pulmonary arteries of the right middle lobe. Tiny embolic burden. No CT evidence of right heart strain.  Assessment and Plan: 1.Small right-sided PE 2.Alcohol and marijuana abuse  The patient will be transitioned to oral Xarelto  and discharged from the emergency department.  The patient and the patient's mother are both okay with this change in plan.  I did discuss blood thinner precautions with the patient.  No more NSAIDs.  I have advised her to quit drinking alcohol completely. I also have advised that should she have any episode of bleeding that does not stop she should come back to the emergency department.  If she has any significant trauma like a car accident she should come into the emergency department for evaluation.  She understood these directions.  Of course her mother has been on Xarelto  for some time as well and can provide guidance. I have also talked her about taking Xarelto  forever. She will need a primary care follow up for future prescriptions  Family Communication: Mother at bedside    Author: ARTHEA CHILD, MD 09/19/2024 1:04 AM  For on call review  [email or web address].      [1]  Allergies Allergen Reactions   Pineapple Anaphylaxis   Pineapple Extract Anaphylaxis   "

## 2024-09-20 ENCOUNTER — Ambulatory Visit (INDEPENDENT_AMBULATORY_CARE_PROVIDER_SITE_OTHER): Admitting: Internal Medicine

## 2024-09-20 ENCOUNTER — Encounter: Payer: Self-pay | Admitting: Internal Medicine

## 2024-09-20 VITALS — BP 110/68 | HR 85 | Resp 16 | Ht 61.0 in | Wt 160.4 lb

## 2024-09-20 DIAGNOSIS — S29011D Strain of muscle and tendon of front wall of thorax, subsequent encounter: Secondary | ICD-10-CM | POA: Diagnosis not present

## 2024-09-20 DIAGNOSIS — I2693 Single subsegmental pulmonary embolism without acute cor pulmonale: Secondary | ICD-10-CM

## 2024-09-20 DIAGNOSIS — D6859 Other primary thrombophilia: Secondary | ICD-10-CM | POA: Diagnosis not present

## 2024-09-20 MED ORDER — METHYLPREDNISOLONE 4 MG PO TBPK: ORAL_TABLET | ORAL | 0 refills | Status: DC

## 2024-09-20 NOTE — Progress Notes (Signed)
 "  Acute Office Visit  Subjective:     Patient ID: Brenda Watson, female    DOB: 06-Jun-2001, 24 y.o.   MRN: [id number]  Chief Complaint  Patient presents with   Abdominal Pain    Feels like cramp on my left side near my ribs, started Tuesday    Abdominal Pain Associated symptoms include myalgias.   Patient is in today for left sided rib/flank pain. She is here with her mother.   Discussed the use of AI scribe software for clinical note transcription with the patient, who gave verbal consent to proceed.  History of Present Illness Brenda Watson is a 24 year old female with protein C deficiency who presents with right-sided chest pain and difficulty breathing.  She describes sudden onset sharp, stabbing pain on the right side of her chest that radiates from the ribs to the shoulder. Pain is pleuritic and worsened by deep breathing, coughing, laughing, and twisting, and it interferes with sleep and movement.  She was diagnosed with a pulmonary embolism on September 18, 2024, with a small clot in the right middle lobe. She started Xarelto  and has had no abnormal bleeding or medication issues.  She has protein C deficiency that predisposes her to blood clots. Hydrocodone , muscle relaxers, and heating pads have not relieved the current chest pain.  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 She notes poor appetite and denies skin changes.    Review of Systems  Respiratory:  Negative for cough, shortness of breath and wheezing.   Musculoskeletal:  Positive for myalgias.        Objective:    BP 110/68   Pulse 85   Resp 16   Ht 5' 1 (1.549 m)   Wt 160 lb 6.4 oz (72.8 kg)   LMP 09/16/2024 Comment: Pt is on her cycle.  SpO2 97%   BMI 30.31 kg/m  BP Readings from Last 3 Encounters:  09/20/24 110/68  09/19/24 101/79  09/16/24 128/84   Wt Readings from Last 3 Encounters:  09/20/24 160 lb 6.4 oz (72.8 kg)  09/18/24 163 lb (73.9 kg)   09/16/24 164 lb (74.4 kg)      Physical Exam Constitutional:      Appearance: Normal appearance.  HENT:     Head: Normocephalic and atraumatic.  Eyes:     Conjunctiva/sclera: Conjunctivae normal.  Cardiovascular:     Rate and Rhythm: Normal rate and regular rhythm.  Pulmonary:     Effort: Pulmonary effort is normal.     Breath sounds: Normal breath sounds.  Musculoskeletal:        General: Tenderness present. No swelling.  Skin:    General: Skin is warm and dry.  Neurological:     General: No focal deficit present.     Mental Status: She is alert. Mental status is at baseline.  Psychiatric:        Mood and Affect: Mood normal.        Behavior: Behavior normal.     No results found for any visits on 09/20/24.      Assessment & Plan:   Assessment & Plan Chest wall muscle strain Pain likely muscular and inflammatory, possibly due to compensatory strain from pulmonary embolism. Limited treatment options due to anticoagulation therapy. - Prescribed Medrol  Dosepak for inflammation, taper over six days. - Recommended lidocaine  patches for localized pain relief. - Advised heating pads and topical medications for symptomatic relief. - Continue muscle relaxers at bedtime. -  Provided work note for absence until January 26th, 2026.  Single subsegmental pulmonary embolism Small clot in right middle lobe, diagnosed January 14th, 2026. Shortness of breath attributed to embolism. Lifelong anticoagulation likely due to protein C deficiency. - Continue Xarelto  as prescribed. - Monitor for signs of abnormal bleeding. - Referred to hematology for further management.  Protein C deficiency Diagnosed in childhood, increasing thromboembolic risk. Lifelong anticoagulation likely required. - Referred to hematology for ongoing management and evaluation of anticoagulation therapy.  - methylPREDNISolone  (MEDROL  DOSEPAK) 4 MG TBPK tablet; Use as directed.  Dispense: 21 each; Refill: 0 -  Ambulatory referral to Hematology / Oncology   Return in about 1 week (around 09/27/2024).  Sharyle Fischer, DO   "

## 2024-09-26 ENCOUNTER — Inpatient Hospital Stay: Attending: Oncology | Admitting: Oncology

## 2024-09-26 ENCOUNTER — Inpatient Hospital Stay

## 2024-09-26 ENCOUNTER — Encounter: Payer: Self-pay | Admitting: Internal Medicine

## 2024-09-26 ENCOUNTER — Ambulatory Visit (INDEPENDENT_AMBULATORY_CARE_PROVIDER_SITE_OTHER): Admitting: Internal Medicine

## 2024-09-26 ENCOUNTER — Other Ambulatory Visit: Payer: Self-pay

## 2024-09-26 ENCOUNTER — Encounter: Payer: Self-pay | Admitting: Oncology

## 2024-09-26 VITALS — BP 122/70 | HR 68 | Temp 98.4°F | Resp 16 | Ht 61.0 in | Wt 167.2 lb

## 2024-09-26 VITALS — BP 138/65 | HR 62 | Temp 98.3°F | Resp 18 | Ht 61.0 in | Wt 166.8 lb

## 2024-09-26 DIAGNOSIS — D6859 Other primary thrombophilia: Secondary | ICD-10-CM

## 2024-09-26 DIAGNOSIS — Z7901 Long term (current) use of anticoagulants: Secondary | ICD-10-CM | POA: Diagnosis not present

## 2024-09-26 DIAGNOSIS — Z793 Long term (current) use of hormonal contraceptives: Secondary | ICD-10-CM | POA: Diagnosis not present

## 2024-09-26 DIAGNOSIS — Z8041 Family history of malignant neoplasm of ovary: Secondary | ICD-10-CM | POA: Insufficient documentation

## 2024-09-26 DIAGNOSIS — F338 Other recurrent depressive disorders: Secondary | ICD-10-CM

## 2024-09-26 DIAGNOSIS — M62838 Other muscle spasm: Secondary | ICD-10-CM

## 2024-09-26 DIAGNOSIS — G43011 Migraine without aura, intractable, with status migrainosus: Secondary | ICD-10-CM

## 2024-09-26 DIAGNOSIS — Z87891 Personal history of nicotine dependence: Secondary | ICD-10-CM | POA: Insufficient documentation

## 2024-09-26 DIAGNOSIS — I2699 Other pulmonary embolism without acute cor pulmonale: Secondary | ICD-10-CM | POA: Diagnosis not present

## 2024-09-26 LAB — ANTITHROMBIN III: AntiThromb III Func: 106 % (ref 75–120)

## 2024-09-26 MED ORDER — SUMATRIPTAN SUCCINATE 100 MG PO TABS: 100.0000 mg | ORAL_TABLET | ORAL | 1 refills | Status: AC | PRN

## 2024-09-26 NOTE — Progress Notes (Signed)
 " [facility]  [phone] [phone]  ID: Kenn Cory OB: February 27, 2001  MR#: [id number]  RDW#:[id number]  Patient Care Team: Bernardo Fend, DO as PCP - General (Internal Medicine)  CHIEF COMPLAINT: Pulmonary embolism, possible protein C deficiency.  INTERVAL HISTORY: Patient is a 24 year old female with recent diagnosis of pulmonary embolism with minimal clot burden.  Her mother carries a diagnosis of protein C deficiency.  Underwent genetic testing at the age of 62 and at that point pediatric oncologist had no concern that she has protein C deficiency.  Patient currently feels well and is asymptomatic.  She has no neurologic complaints.  She denies any recent fevers or illnesses.  She has a good appetite and denies weight loss.  She has no chest pain, shortness of breath, cough, or hemoptysis.  She denies any nausea, vomiting, constipation, or diarrhea.  She has no urinary complaints.  Patient offers no specific complaints today.  REVIEW OF SYSTEMS:   Review of Systems  Constitutional: Negative.  Negative for fever, malaise/fatigue and weight loss.  Respiratory: Negative.  Negative for cough, hemoptysis and shortness of breath.   Cardiovascular: Negative.  Negative for chest pain and leg swelling.  Gastrointestinal: Negative.  Negative for abdominal pain.  Genitourinary: Negative.  Negative for dysuria.  Musculoskeletal: Negative.  Negative for back pain.  Skin: Negative.  Negative for rash.  Neurological: Negative.  Negative for dizziness, focal weakness, weakness and headaches.  Psychiatric/Behavioral: Negative.  The patient is not nervous/anxious.     As per HPI. Otherwise, a complete review of systems is negative.  PAST MEDICAL HISTORY: Past Medical History:  Diagnosis Date   Menorrhagia    Protein C deficiency    mother states pt has deficiency. MD telephone call [facility] 12/06/17 says she does not but decreased levels on 2/19 labs    Vaccine for human papilloma virus (HPV) types 6, 11, 16, and 18 administered     PAST SURGICAL HISTORY: Past Surgical History:  Procedure Laterality Date   TONSILLECTOMY Bilateral 09/04/2018   Procedure: TONSILLECTOMY;  Surgeon: Blair Mt, MD;  Location: [facility];  Service: ENT;  Laterality: Bilateral;    FAMILY HISTORY: Family History  Problem Relation Age of Onset   Protein C deficiency Mother    Migraines Mother    Cancer Sister 47       leoblastoma   Ovarian cancer Maternal Aunt 42       vs uterine   Hypertension Maternal Grandmother    Asthma Paternal Grandmother     ADVANCED DIRECTIVES (Y/N):  N  HEALTH MAINTENANCE: Social History[1]   Colonoscopy:  PAP:  Bone density:  Lipid panel:  Allergies[2]  Current Outpatient Medications  Medication Sig Dispense Refill   RIVAROXABAN  (XARELTO ) VTE STARTER PACK (15 & 20 MG) Follow package directions: Take one 15mg  tablet by mouth twice a day. On day 22, switch to one 20mg  tablet once a day. Take with food. 51 each 0   SUMAtriptan  (IMITREX ) 100 MG tablet Take 1 tablet (100 mg total) by mouth every 2 (two) hours as needed for migraine. May repeat in 2 hours if headache persists or recurs. 10 tablet 1   tiZANidine  (ZANAFLEX ) 4 MG tablet Take 1 tablet (4 mg total) by mouth every 8 (eight) hours as needed for muscle spasms. (Patient not taking: Reported on 09/26/2024) 30 tablet 0   Current Facility-Administered Medications  Medication Dose Route Frequency Provider Last Rate Last Admin   medroxyPROGESTERone  (DEPO-PROVERA ) injection 150 mg  150  mg Intramuscular Q90 days         OBJECTIVE: Vitals:   09/26/24 1120  BP: 138/65  Pulse: 62  Resp: 18  Temp: 98.3 F (36.8 C)  SpO2: 100%     Body mass index is 31.52 kg/m.    ECOG FS:0 - Asymptomatic  General: Well-developed, well-nourished, no acute distress. Eyes: Pink conjunctiva, anicteric sclera. HEENT: Normocephalic, moist mucous membranes. Lungs: No audible  wheezing or coughing. Heart: Regular rate and rhythm. Abdomen: Soft, nontender, no obvious distention. Musculoskeletal: No edema, cyanosis, or clubbing. Neuro: Alert, answering all questions appropriately. Cranial nerves grossly intact. Skin: No rashes or petechiae noted. Psych: Normal affect. Lymphatics: No cervical, calvicular, axillary or inguinal LAD.   LAB RESULTS:  Lab Results  Component Value Date   NA 140 09/18/2024   K 4.0 09/18/2024   CL 109 09/18/2024   CO2 20 (L) 09/18/2024   GLUCOSE 84 09/18/2024   BUN 8 09/18/2024   CREATININE 0.72 09/18/2024   CALCIUM 8.7 (L) 09/18/2024   PROT 7.1 09/18/2024   ALBUMIN 4.3 09/18/2024   AST 15 09/18/2024   ALT 7 09/18/2024   ALKPHOS 72 09/18/2024   BILITOT 0.8 09/18/2024   GFRNONAA >60 09/18/2024   GFRAA NOT CALCULATED 11/11/2019    Lab Results  Component Value Date   WBC 5.9 09/18/2024   NEUTROABS 3.4 09/18/2024   HGB 12.2 09/18/2024   HCT 37.1 09/18/2024   MCV 101.9 (H) 09/18/2024   PLT 206 09/18/2024     STUDIES: CT Angio Chest PE W and/or Wo Contrast Result Date: 09/18/2024 EXAM: CTA CHEST 09/18/2024 09:19:09 PM TECHNIQUE: CTA of the chest was performed after the administration of 75 mL of intravenous contrast (iohexol  (OMNIPAQUE ) 350 MG/ML injection). Multiplanar reformatted images are provided for review. MIP images are provided for review. Automated exposure control, iterative reconstruction, and/or weight based adjustment of the mA/kV was utilized to reduce the radiation dose to as low as reasonably achievable. COMPARISON: None available. CLINICAL HISTORY: Pulmonary embolism (PE), low to intermediate probability, positive D-dimer. FINDINGS: PULMONARY ARTERIES: Pulmonary arteries are adequately opacified for evaluation. A single branching intraluminal filling defect is seen within the segmental pulmonary arteries of the right middle lobe in keeping with acute pulmonary embolus. The embolic burden is tiny. The central  pulmonary arteries are of normal caliber. MEDIASTINUM: Global cardiac size within normal limits. No significant coronary artery calcification. No CT evidence of right heart strain. Residual thymic tissue within the anterior mediastinum. There is no acute abnormality of the thoracic aorta. LYMPH NODES: No mediastinal, hilar or axillary lymphadenopathy. LUNGS AND PLEURA: The lungs are without acute process. No focal consolidation or pulmonary edema. No evidence of pleural effusion or pneumothorax. UPPER ABDOMEN: Limited images of the upper abdomen are unremarkable. SOFT TISSUES AND BONES: No acute bone or soft tissue abnormality. IMPRESSION: 1. Acute pulmonary embolus in the segmental pulmonary arteries of the right middle lobe. Tiny embolic burden. No CT evidence of right heart strain. Electronically signed by: Dorethia Molt MD 09/18/2024 09:25 PM EST RP Workstation: [id number]   US  Venous Img Lower Unilateral Left Result Date: 09/16/2024 CLINICAL DATA:  LEFT calf pain for 4 days EXAM: LEFT LOWER EXTREMITY VENOUS DOPPLER ULTRASOUND TECHNIQUE: Gray-scale sonography with compression, as well as color and duplex ultrasound, were performed to evaluate the deep venous system(s) from the level of the common femoral vein through the popliteal and proximal calf veins. COMPARISON:  None available FINDINGS: VENOUS Normal compressibility of the common femoral, superficial femoral, and popliteal  veins, as well as the visualized calf veins. Visualized portions of profunda femoral vein and great saphenous vein unremarkable. No filling defects to suggest DVT on grayscale or color Doppler imaging. Doppler waveforms show normal direction of venous flow, normal respiratory plasticity and response to augmentation. Limited views of the contralateral common femoral vein are unremarkable. OTHER None. Limitations: none IMPRESSION: No DVT of the left lower extremity. Electronically Signed   By: Aliene Lloyd M.D.   On: 09/16/2024 16:57     ASSESSMENT: Pulmonary embolism, possible protein C deficiency.  PLAN:    Pulmonary embolism: Diagnosed by CT scan on September 18, 2024 without right heart strain and tiny embolic burden.  Patient will require 6 months of anticoagulation and is currently taking Xarelto .  Will do a full hypercoagulable workup today for completeness.  Return to clinic in 2 months for further evaluation. 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  Pediatric oncology at that time reported no concern...of protein C deficiency.  Case discussed with genetic counseling.   Family history: Patient has an aunt with ovarian cancer at 7 and a younger sister who died of glioma therefore genetic testing may be warranted.  Genetic counseling as above.    I spent a total of 60 minutes reviewing chart data, face-to-face evaluation with the patient, counseling and coordination of care as detailed above.   Patient expressed understanding and was in agreement with this plan. She also understands that She can call clinic at any time with any questions, concerns, or complaints.    Evalene JINNY Reusing, MD   09/26/2024 1:51 PM         [1]  Social History Tobacco Use   Smoking status: Never   Smokeless tobacco: Never  Vaping Use   Vaping status: Former  Substance Use Topics   Alcohol use: No   Drug use: Yes    Types: Marijuana  [2]  Allergies Allergen Reactions   Pineapple Anaphylaxis   Pineapple Extract Anaphylaxis   "

## 2024-09-26 NOTE — Progress Notes (Signed)
 "  Acute Office Visit  Subjective:     Patient ID: Brenda Watson, female    DOB: 2001/05/15, 24 y.o.   MRN: [id number]  Chief Complaint  Patient presents with   Follow-up    1 week recheck    Abdominal Pain Associated symptoms include headaches.   Patient is in today for follow up on left sided rib/flank pain.   Discussed the use of AI scribe software for clinical note transcription with the patient, who gave verbal consent to proceed.  History of Present Illness  Brenda Watson is a 24 year old female who presents with muscle spasms and migraine management.  She has new quadriceps muscle spasms that feel like her muscles are jumping. They started while she was in bed and have persisted despite stretching. She is taking tizanidine  prescribed in the ER.  She has migraines and uses Imitrex . A higher dose has helped, and her migraines are now less frequent.  She describes seasonal depression and stopped Prozac , preferring lifestyle changes.  She recently completed a steroid course for her chest wall pain but this has resolved. Now has difficulty staying asleep, waking in the middle of the night and unable to fall back asleep. She is off steroids and monitoring her sleep.    Review of Systems  Neurological:  Positive for headaches.        Objective:    BP 122/70 (Cuff Size: Large)   Pulse 68   Temp 98.4 F (36.9 C) (Oral)   Resp 16   Ht 5' 1 (1.549 m)   Wt 167 lb 3.2 oz (75.8 kg)   LMP 09/16/2024 Comment: Pt is on her cycle.  SpO2 98%   BMI 31.59 kg/m  BP Readings from Last 3 Encounters:  09/26/24 122/70  09/26/24 138/65  09/20/24 110/68   Wt Readings from Last 3 Encounters:  09/26/24 167 lb 3.2 oz (75.8 kg)  09/26/24 166 lb 12.8 oz (75.7 kg)  09/20/24 160 lb 6.4 oz (72.8 kg)      Physical Exam Constitutional:      Appearance: Normal appearance.  HENT:     Head: Normocephalic and atraumatic.  Eyes:     Conjunctiva/sclera: Conjunctivae normal.   Cardiovascular:     Rate and Rhythm: Normal rate and regular rhythm.  Pulmonary:     Effort: Pulmonary effort is normal.     Breath sounds: Normal breath sounds. No wheezing, rhonchi or rales.  Skin:    General: Skin is warm and dry.  Neurological:     General: No focal deficit present.     Mental Status: She is alert. Mental status is at baseline.  Psychiatric:        Mood and Affect: Mood normal.        Behavior: Behavior normal.     No results found for any visits on 09/26/24.      Assessment & Plan:   Assessment & Plan  Intractable migraine without aura with status migrainosus Migraines decreased with increased Imitrex  dose. Current abortive therapy effective. - Refilled Imitrex  at increased dose for abortive therapy.  Muscle spasm of lower extremity Muscle spasm possibly related to low calcium levels. Recent labs show slightly low calcium. - Advised tizanidine  at bedtime for muscle relaxation. - Recommended topical Bengay or Icy Hot on affected leg for a few nights. - Advised increasing dietary calcium intake through milk, dairy products, and Tums.  Seasonal depressive symptoms Depression appears seasonal. Prefers lifestyle modifications over medication. - Encouraged exercise, outdoor activities,  and good sleep hygiene. - Offered to discuss alternative medications if symptoms worsen.  - SUMAtriptan  (IMITREX ) 100 MG tablet; Take 1 tablet (100 mg total) by mouth every 2 (two) hours as needed for migraine. May repeat in 2 hours if headache persists or recurs.  Dispense: 10 tablet; Refill: 1   Return in about 6 months (around 03/26/2025).  Brenda Fischer, DO   "

## 2024-09-27 LAB — LUPUS ANTICOAGULANT PANEL
DRVVT: 85.8 s — ABNORMAL HIGH (ref 0.0–47.0)
PTT Lupus Anticoagulant: 38.1 s (ref 0.0–43.5)

## 2024-09-27 LAB — PROTEIN C ACTIVITY: Protein C Activity: 78 % (ref 73–180)

## 2024-09-27 LAB — PROTEIN S ACTIVITY: Protein S Activity: 121 % (ref 63–140)

## 2024-09-27 LAB — DRVVT CONFIRM: dRVVT Confirm: 1.7 ratio — ABNORMAL HIGH (ref 0.8–1.2)

## 2024-09-27 LAB — DRVVT MIX: dRVVT Mix: 68.3 s — ABNORMAL HIGH (ref 0.0–40.4)

## 2024-09-27 LAB — PROTEIN S, TOTAL: Protein S Ag, Total: 115 % (ref 60–150)

## 2024-09-30 LAB — PROTHROMBIN GENE MUTATION

## 2024-09-30 LAB — FACTOR 5 LEIDEN

## 2024-10-02 LAB — PROTEIN C, TOTAL: Protein C, Total: 73 % (ref 60–150)

## 2024-10-06 LAB — CARDIOLIPIN ANTIBODIES, IGG, IGM, IGA
Anticardiolipin IgA: <9 U/mL (ref 0–11)
Anticardiolipin IgG: <9 GPL U/mL (ref 0–14)
Anticardiolipin IgM: 10 [MPL'U]/mL (ref 0–12)

## 2024-10-11 NOTE — Addendum Note (Signed)
 Addended by: YVONE WARREN BROCKS on: 10/11/2024 02:36 PM   Modules accepted: Orders

## 2024-11-26 ENCOUNTER — Inpatient Hospital Stay: Admitting: Oncology

## 2025-03-26 ENCOUNTER — Ambulatory Visit: Admitting: Internal Medicine
